# Patient Record
Sex: Female | Born: 1975 | Race: Black or African American | Hispanic: No | Marital: Married | Smoking: Never smoker
Health system: Southern US, Community
[De-identification: ages and names within clinical notes are randomized; demographics above are authoritative.]

## PROBLEM LIST (undated history)

## (undated) ENCOUNTER — Inpatient Hospital Stay (HOSPITAL_COMMUNITY): Payer: Self-pay

## (undated) DIAGNOSIS — D649 Anemia, unspecified: Secondary | ICD-10-CM

## (undated) DIAGNOSIS — R87629 Unspecified abnormal cytological findings in specimens from vagina: Secondary | ICD-10-CM

## (undated) DIAGNOSIS — O139 Gestational [pregnancy-induced] hypertension without significant proteinuria, unspecified trimester: Secondary | ICD-10-CM

## (undated) DIAGNOSIS — F419 Anxiety disorder, unspecified: Secondary | ICD-10-CM

## (undated) HISTORY — DX: Anemia, unspecified: D64.9

## (undated) HISTORY — PX: HIP SURGERY: SHX245

## (undated) HISTORY — DX: Anxiety disorder, unspecified: F41.9

## (undated) HISTORY — PX: CRYOTHERAPY: SHX1416

## (undated) HISTORY — DX: Unspecified abnormal cytological findings in specimens from vagina: R87.629

---
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
## 2009-12-20 ENCOUNTER — Emergency Department (HOSPITAL_COMMUNITY): Admission: EM | Admit: 2009-12-20 | Discharge: 2009-12-20 | Payer: Self-pay | Admitting: Emergency Medicine

## 2010-02-10 ENCOUNTER — Ambulatory Visit: Payer: Self-pay | Admitting: Family Medicine

## 2010-02-10 ENCOUNTER — Other Ambulatory Visit: Admission: RE | Admit: 2010-02-10 | Discharge: 2010-02-10 | Payer: Self-pay | Admitting: Family Medicine

## 2010-02-10 LAB — HM PAP SMEAR

## 2010-02-11 ENCOUNTER — Encounter: Payer: Self-pay | Admitting: Family Medicine

## 2010-02-11 LAB — CONVERTED CEMR LAB
Calcium: 9 mg/dL (ref 8.4–10.5)
Cholesterol: 120 mg/dL (ref 0–200)
Creatinine, Ser: 0.6 mg/dL (ref 0.4–1.2)
LDL Cholesterol: 69 mg/dL (ref 0–99)
Potassium: 4 meq/L (ref 3.5–5.1)
Sodium: 136 meq/L (ref 135–145)
Triglycerides: 29 mg/dL (ref 0.0–149.0)
VLDL: 5.8 mg/dL (ref 0.0–40.0)

## 2010-02-16 ENCOUNTER — Encounter: Payer: Self-pay | Admitting: Family Medicine

## 2010-02-16 LAB — CONVERTED CEMR LAB: Pap Smear: NEGATIVE

## 2010-04-27 ENCOUNTER — Ambulatory Visit: Payer: Self-pay | Admitting: Family Medicine

## 2010-04-27 DIAGNOSIS — J029 Acute pharyngitis, unspecified: Secondary | ICD-10-CM | POA: Insufficient documentation

## 2010-04-27 DIAGNOSIS — L259 Unspecified contact dermatitis, unspecified cause: Secondary | ICD-10-CM

## 2010-04-28 ENCOUNTER — Encounter: Payer: Self-pay | Admitting: Family Medicine

## 2010-04-28 ENCOUNTER — Telehealth: Payer: Self-pay | Admitting: Family Medicine

## 2010-05-02 ENCOUNTER — Telehealth: Payer: Self-pay | Admitting: Family Medicine

## 2010-05-31 ENCOUNTER — Ambulatory Visit
Admission: RE | Admit: 2010-05-31 | Discharge: 2010-05-31 | Payer: Self-pay | Source: Home / Self Care | Attending: Family Medicine | Admitting: Family Medicine

## 2010-05-31 DIAGNOSIS — J069 Acute upper respiratory infection, unspecified: Secondary | ICD-10-CM | POA: Insufficient documentation

## 2010-06-21 NOTE — Letter (Signed)
Summary: Out of Work  [employer] at [facility]  [address] [place], [place] [address]   Phone: [phone]  Fax: [phone]    April 28, 2010   Employee:  PROSPERITY DARROUGH [facility]    To Whom It May Concern:   For Medical reasons, please excuse the above named employee from work for the following dates:  Start:   Patient should return to work on Monday, May 02, 2010.    If you need additional information, please feel free to contact our office.         Sincerely,      Dr. Ruthe Mannan

## 2010-06-21 NOTE — Letter (Signed)
Summary: Generic Letter  [place] at [facility]  [address] [place], [place] [address]   Phone: [phone]  Fax: [phone]    02/11/2010  [facility] [address] [place], [place]  [address]  Dear Ms. Gabrielle Hill,    It was nice to meet you Gabrielle Hill!  We have received your lab results and Dr. Dayton Martes says that your labs are fabulous!! Electrolytes, kidney, and liver functions are great.  Cholesterol is amazing!  Keep up the good work.  Enclosed you will find a copy of your lab results.    Sincerely,        Linde Gillis CMA (AAMA)for Dr. Ruthe Mannan

## 2010-06-21 NOTE — Letter (Signed)
Summary: Out of Work  [employer] at [facility]  [address] [place], [place] [address]   Phone: [phone]  Fax: [phone]    April 27, 2010   Employee:  Gabrielle Hill [employer]    To Whom It May Concern:   For Medical reasons, please excuse the above named employee from work for the following dates:  Start:   04/25/2010  End:   04/28/2010  If you need additional information, please feel free to contact our office.         Sincerely,    Ruthe Mannan MD

## 2010-06-21 NOTE — Progress Notes (Signed)
Summary: needs work note extended  Phone Note Call from Patient Call back at [phone]   Caller: Patient Call For: Ruthe Mannan MD Summary of Call: Pt is asking to have work note extended through tomorrow.  She still has a sore throat and it is difficult for her to talk.  Call when ready and she will pick up. Initial call taken by: Lowella Petties CMA, AAMA,  April 28, 2010 12:23 PM  Follow-up for Phone Call        ok to write her a note stating that she can be out. Ruthe Mannan MD  April 28, 2010 12:26 PM  Patient advised as instructed via telephone, note for work ready for pick up will be left at front desk. Follow-up by: Linde Gillis CMA Duncan Dull),  April 28, 2010 12:33 PM

## 2010-06-21 NOTE — Letter (Signed)
Summary: Results Follow up Letter  [place] at [facility]  [address] [place], [place] [address]   Phone: [phone]  Fax: [phone]    02/16/2010 MRN: [id number]  [facility] [address] [place], [place]  [address]  Dear Gabrielle Hill,  The following are the results of your recent test(s):  Test         Result    Pap Smear:        Normal __X___  Not Normal _____ Comments: ______________________________________________________ Cholesterol: LDL(Bad cholesterol):         Your goal is less than:         HDL (Good cholesterol):       Your goal is more than: Comments:  ______________________________________________________ Mammogram:        Normal _____  Not Normal _____ Comments:  ___________________________________________________________________ Hemoccult:        Normal _____  Not normal _______ Comments:    _____________________________________________________________________ Other Tests:    We routinely do not discuss normal results over the telephone.  If you desire a copy of the results, or you have any questions about this information we can discuss them at your next office visit.   Sincerely,      Linde Gillis, CMA (AAMA) Dr. Ruthe Mannan, MD

## 2010-06-21 NOTE — Assessment & Plan Note (Signed)
Summary: new patiient/wants cpx if possible/rbh   Vital Signs:  Patient profile:   35 year old female Height:      66.75 inches Weight:      194 pounds BMI:     30.72 Temp:     98.2 degrees F oral Pulse rate:   68 / minute Pulse rhythm:   regular BP sitting:   110 / 64  (right arm) Cuff size:   regular  Vitals Entered By: Linde Gillis CMA Duncan Dull) (February 10, 2010 1:53 PM) CC: new patient, establish care   History of Present Illness: 35 yo here to establish care and for CPX with no complaints.  Well woman- G1P1, not currently sexually active.  No h/o abnormal pap smears.  Denies any urinary frequeny, vaginal discharge or other complaints.   Very active, works out and job requires high level of activity.  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    Due for tetanus shot, flu shot, and screening labs.  Preventive Screening-Counseling & Management  Alcohol-Tobacco     Smoking Status: never  Caffeine-Diet-Exercise     Does Patient Exercise: yes  Current Medications (verified): 1)  None  Allergies (verified): No Known Drug Allergies  Past History:  Family History: Last updated: 02/10/2010 Mom- sarcoidosis, s/p lung transplant, post op diabetes.  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  Risk Factors: Exercise: yes (02/10/2010)  Risk Factors: Smoking Status: never (02/10/2010)  Past Medical History: G1P1, uncomplicated pregnancy and delivery  Past Surgical History: Denies surgical history  Family History: Mom- sarcoidosis, s/p lung transplant, post op diabetes.  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 Smoking Status:  never Does Patient Exercise:  yes  Review of Systems      See HPI General:   Denies malaise. Eyes:  Denies blurring. ENT:  Denies difficulty swallowing. CV:  Denies chest pain or discomfort. Resp:  Denies shortness of breath. GI:  Denies abdominal pain and change in bowel habits. GU:  Denies abnormal vaginal bleeding, discharge, dysuria, genital sores, and hematuria. MS:  Denies joint pain, joint redness, and joint swelling. Derm:  Denies rash. Neuro:  Denies falling down and headaches. Psych:  Denies anxiety and depression. Endo:  Denies cold intolerance and heat intolerance. Heme:  Denies abnormal bruising and bleeding.  Physical Exam  General:  alert and well-developed.   Head:  normocephalic and atraumatic.   Eyes:  vision grossly intact, pupils equal, pupils round, and pupils reactive to light.   Ears:  R ear normal and L ear normal.   Nose:  no external deformity.   Mouth:  good dentition.   Neck:  No deformities, masses, or tenderness noted. Breasts:  No mass, nodules, thickening, tenderness, bulging, retraction, inflamation, nipple discharge or skin changes noted.   Lungs:  Normal respiratory effort, chest expands symmetrically. Lungs are clear to auscultation, no crackles or wheezes. Heart:  Normal rate and regular rhythm. S1 and S2 normal without gallop, murmur, click, rub or other extra sounds. Abdomen:  Bowel sounds positive,abdomen soft and non-tender without masses, organomegaly or hernias noted. Rectal:  no external abnormalities and no hemorrhoids.   Genitalia:  Pelvic Exam:        External: normal female genitalia without lesions or masses        Vagina: normal  without lesions or masses        Cervix: normal without lesions or masses        Adnexa: normal bimanual exam without masses or fullness        Uterus: normal by palpation        Pap smear: performed Msk:  No deformity or scoliosis noted of thoracic or lumbar spine.   Extremities:  No clubbing, cyanosis, edema, or deformity noted with normal full range of motion of all joints.     Neurologic:  alert & oriented X3 and gait normal.   Skin:  Intact without suspicious lesions or rashes Psych:  Oriented X3, normally interactive, good eye contact, not anxious appearing, and not depressed appearing.     Impression & Recommendations:  Problem # 1:  PREVENTIVE HEALTH CARE (ICD-V70.0) Reviewed preventive care protocols, scheduled due services, and updated immunizations Discussed nutrition, exercise, diet, and healthy lifestyle.  Pap, Tdap, flu shot today. BMET, direct LDL today. Orders: Venipuncture (93235) TLB-BMP (Basic Metabolic Panel-BMET) (80048-METABOL)  Other Orders: TLB-Lipid Panel (80061-LIPID) Flu Vaccine 70yrs + (57322) Admin 1st Vaccine (02542) Tdap => 50yrs IM (70623) Admin of Any Addtl Vaccine (76283)  Patient Instructions: 1)  It was so nice to meet you, Gabrielle Hill. 2)  We will be in touch with your lab work and pap smear. 3)  Please let us know if you need [place] for anything!  Prior Medications (reviewed today): None Current Allergies (reviewed today): No known allergies   Prevention & Chronic Care Immunizations   Influenza vaccine: Fluvax 3+  (02/10/2010)    Tetanus booster: 02/10/2010: Tdap    Pneumococcal vaccine: Not documented  Other Screening   Pap smear: Not documented   Pap smear action/deferral: Ordered  (02/10/2010)   Smoking status: never  (02/10/2010)   Nursing Instructions: Give tetanus booster today Give Flu vaccine today Pap smear today    Past Medical History:    G1P1, uncomplicated pregnancy and delivery  Past Surgical History:    Denies surgical history    Immunizations Administered:  Influenza Vaccine # 1:    Vaccine Type: Fluvax 3+    Site: right deltoid    Mfr: GlaxoSmithKline    Dose: 0.5 ml    Route: IM    Given by: Linde Gillis CMA (AAMA)    Exp. Date: 11/19/2010    Lot #: [id number]    VIS given: 12/14/09 version given February 10, 2010.  Tetanus Vaccine:    Vaccine Type: Tdap    Site:  right deltoid    Mfr: GlaxoSmithKline    Dose: 0.5 ml    Route: IM    Given by: Linde Gillis CMA (AAMA)    Exp. Date: 02/10/2012    Lot #: [id number]    VIS given: 04/08/08 version given February 10, 2010.

## 2010-06-21 NOTE — Assessment & Plan Note (Signed)
Summary: F/U FROM URGENT CARE FACILITY OUT OF TOWN / LFW   Vital Signs:  Patient profile:   35 year old female Height:      66.75 inches Weight:      188 pounds BMI:     29.77 Temp:     98.7 degrees F oral Pulse rate:   72 / minute Pulse rhythm:   regular BP sitting:   130 / 90  (left arm) Cuff size:   regular  Vitals Entered By: Linde Gillis CMA Duncan Dull) (April 27, 2010 10:39 AM) CC: urgent care follow up   History of Present Illness: 35 yo here for urgent care follow up.  Went on a trip to [place]. The day after she arrived, she wake up with a rash around her eyes on her cheeks.  Rash was very itchy (she bring pictures of rash)- appears erythematous, macular/papular. Rash was non painful.  No eruptions elsewhere on her body.  A few hours later, throat started hurting, felt feverish and very achy.   Went to an urgent care (awaiting records).  Per pt, rapid strep was negative. Sent home with 10 day course of Penicillin and 5 day course of prednisone 40 mg daily ( has one dose left). Rash has almost completely resolved. Itching improved with Benadryl. Throat no longer sore and she is afebrile. No other URI symptoms.  Current Medications (verified): 1)  None  Allergies (verified): No Known Drug Allergies  Past History:  Past Medical History: Last updated: 02/10/2010 G1P1, uncomplicated pregnancy and delivery  Past Surgical History: Last updated: 02/10/2010 Denies surgical history  Family History: Last updated: 02/10/2010 Mom- sarcoidosis, s/p lung transplant, post op diabetes.  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  Risk Factors: Exercise: yes (02/10/2010)  Risk Factors: Smoking Status: never (02/10/2010)  Review of Systems      See HPI General:  Denies fever. CV:  Denies chest pain or discomfort. Resp:  Denies cough. GI:  Denies  abdominal pain.  Physical Exam  General:  alert and well-developed.   VSS, non toxic appearing. Head:  normocephalic and atraumatic.   Eyes:  vision grossly intact, pupils equal, and pupils round.   Ears:  R ear normal and L ear normal.   Nose:  no external deformity.   Mouth:  good dentition and no gingival abnormalities.   Neck:  No deformities, masses, or tenderness noted. Lungs:  Normal respiratory effort, chest expands symmetrically. Lungs are clear to auscultation, no crackles or wheezes. Heart:  Normal rate and regular rhythm. S1 and S2 normal without gallop, murmur, click, rub or other extra sounds. Extremities:  No clubbing, cyanosis, edema, or deformity noted with normal full range of motion of all joints.   Skin:  very faint, barely visible raised erythematous area on left cheek Psych:  Oriented X3, normally interactive, good eye contact, not anxious appearing, and not depressed appearing.     Impression & Recommendations:  Problem # 1:  SORE THROAT (ICD-462) Assessment New Resolving.  Agree with finish course of PCN regardless of neg rapid strep. Rash,however, does not seem consistent with scarlet fever.  Problem # 2:  DERMATITIS (ICD-692.9) Assessment: New Resolving and appearins unrelated to #1.  Will await records but this appears like an allergic dermaitis.  Advised to call me immediately if rash worsens once she stops pred.   Continue Benadryl as needed itching.   Orders Added: 1)  Est. Patient Level IV [id number]    Current Allergies (reviewed today): No known allergies

## 2010-06-23 NOTE — Progress Notes (Signed)
Summary: call a nurse  Phone Note Call from Patient   Summary of Call: Triage Record Num: [id number] Operator: Migdalia Dk Patient Name: Gabrielle Hill Call Date & Time: 05/01/2010 7:13:43AM Patient Phone: [phone] PCP: Ruthe Mannan Patient Gender: Female PCP Fax : [phone] Patient DOB: [birth year] Practice Name: [facility] Reason for Call: Pt. states taking PCN and Prednisone since 04/24/2010. Seen in office on 05/06/2010 for follow up and instructed to keep taking medications as prescribed. "I was taking the Prednisone for a rash and my last dose of Prednisone was on 04/29/2010 and now the rash is back." This a.m. rash back on face, fine red rash, itching. "Dr. Dayton Martes told me to call if the rash came back." Spoke with Dr. Dayton Martes and to call in Prednisone 20 mg, 2 tabs po qd x 3 days then 1 tab qd x 3 days, then 1/2 tab qd x 2 days-- if rash continues to call office. Pt. informed and voiced understanding. Script for Prednisone 20 mg tabs, disp 10 , no refills, to be taken as above--called to [facility] at [phone] and left on voicemail. Protocol(s) Used: Medication Question Calls, No Triage (Adults) Recommended Outcome per Protocol: Call Provider Immediately Reason for Outcome: [1] Urgent new prescription or refill (likelihood of harm to patient if not taken) AND [2] triager unable to fill per unit policy Care Advice:  ~ 05/01/2010 7:32:56AM Page 1 of 1 [email or web address] Initial call taken by: Melody Comas,  May 02, 2010 8:10 AM  Follow-up for Phone Call        Left a message on voicemail for patient to call back. Sydell Axon LPN  May 02, 2010 11:41 AM  Spoke to patient and was informed that she took 2 of the Prednisone yesterday and 2 today and the rash is gone.  Patient will finish the medication and call back for a referral to a dermotologist if the rash comes back. Follow-up by: Sydell Axon LPN,  May 02, 2010 3:30 PM     Can you please call and  check on [name]? Thanks, Ruthe Mannan MD  May 02, 2010 8:11 AM

## 2010-06-23 NOTE — Assessment & Plan Note (Signed)
Summary: STREP THROAT ???   Vital Signs:  Patient profile:   35 year old female Height:      66.75 inches Weight:      189.75 pounds BMI:     30.05 Temp:     99 degrees F oral Pulse rate:   84 / minute Pulse rhythm:   regular BP sitting:   110 / 76  (left arm) Cuff size:   regular  Vitals Entered By: Delilah Shan CMA Maitlyn Penza Dull) (May 31, 2010 2:09 PM) CC: ? Strep   History of Present Illness: Tired and feeling warm.  Overnight pain with swallowing and insomnia.  Taking tylenol and tea.  Using cough drops and tylenol.  Went to early work shift this AM.  No cough.  Ear pain bilaterally.  No nose congestion.  Voice is deeper today. No material sticking with swallowing.   Allergies: No Known Drug Allergies  Review of Systems       See HPI.  Otherwise negative.    Physical Exam  General:  GEN: nad, alert and oriented HEENT: mucous membranes moist, TM w/o erythema, nasal epithelium injected, OP with cobblestoning but no exudates NECK: supple w/o LA CV: rrr. PULM: ctab, no inc wob ABD: soft, +bs EXT: no edema    Impression & Recommendations:  Problem # 1:  URI (ICD-465.9) Likely viral and no indication for antibiotics.  follow up as needed.  d/w patient.  Supporitve tx o/w.  Nontoxic.  she agrees.   Complete Medication List: 1)  Oral Contraceptives  .... Once daily 2)  Lidocaine Viscous 2 % Soln (Lidocaine hcl) .... 5 ml swish and swallow qid as needed for sore throat  Other Orders: Rapid Strep ([address])  Patient Instructions: 1)  I think you have a viral infection that will gradually resolve.  I would get some rest, drink plenty of fluids, and take ibuprofen with food as needed.  Gargle with warm salt water for your throat.  If the pain continues, try the lidocaine.  Take care.  Prescriptions: LIDOCAINE VISCOUS 2 % SOLN (LIDOCAINE HCL) 5 ml swish and swallow qid as needed for sore throat  #6oz x 0   Entered and Authorized by:   Crawford Givens MD   Signed by:   Crawford Givens MD on 05/31/2010   Method used:   Print then Give to Patient   RxID:   [id number]    Orders Added: 1)  Rapid Strep [95621] 2)  Est. Patient Level III [30865]    Current Allergies (reviewed today): No known allergies   Laboratory Results  Date/Time Received: May 31, 2010 2:11 PM   Other Tests  Rapid Strep: negative

## 2010-08-05 LAB — CBC
HCT: 35.5 % — ABNORMAL LOW (ref 36.0–46.0)
Hemoglobin: 11.8 g/dL — ABNORMAL LOW (ref 12.0–15.0)
MCV: 83.1 fL (ref 78.0–100.0)
RBC: 4.27 MIL/uL (ref 3.87–5.11)

## 2010-08-05 LAB — DIFFERENTIAL
Basophils Absolute: 0 10*3/uL (ref 0.0–0.1)
Basophils Relative: 1 % (ref 0–1)
Lymphocytes Relative: 36 % (ref 12–46)
Monocytes Relative: 9 % (ref 3–12)

## 2010-08-05 LAB — URINE MICROSCOPIC-ADD ON

## 2010-08-05 LAB — URINALYSIS, ROUTINE W REFLEX MICROSCOPIC
Bilirubin Urine: NEGATIVE
Nitrite: NEGATIVE
Protein, ur: NEGATIVE mg/dL
Specific Gravity, Urine: 1.008 (ref 1.005–1.030)
Urobilinogen, UA: 0.2 mg/dL (ref 0.0–1.0)

## 2010-08-05 LAB — COMPREHENSIVE METABOLIC PANEL
AST: 22 U/L (ref 0–37)
Albumin: 3.9 g/dL (ref 3.5–5.2)
BUN: 7 mg/dL (ref 6–23)
Chloride: 107 mEq/L (ref 96–112)
GFR calc Af Amer: >60 mL/min (ref >60)

## 2010-08-05 LAB — POCT PREGNANCY, URINE: Preg Test, Ur: NEGATIVE

## 2010-08-05 LAB — LIPASE, BLOOD: Lipase: 23 U/L (ref 11–59)

## 2010-12-06 ENCOUNTER — Encounter: Payer: Self-pay | Admitting: Family Medicine

## 2010-12-07 ENCOUNTER — Ambulatory Visit (INDEPENDENT_AMBULATORY_CARE_PROVIDER_SITE_OTHER): Payer: 59 | Admitting: Family Medicine

## 2010-12-07 ENCOUNTER — Encounter: Payer: Self-pay | Admitting: Family Medicine

## 2010-12-07 DIAGNOSIS — G43909 Migraine, unspecified, not intractable, without status migrainosus: Secondary | ICD-10-CM | POA: Insufficient documentation

## 2010-12-07 MED ORDER — CYCLOBENZAPRINE HCL 10 MG PO TABS: 5.0000 mg | ORAL_TABLET | Freq: Three times a day (TID) | ORAL | Status: DC | PRN

## 2010-12-07 MED ORDER — PROMETHAZINE HCL 25 MG PO TABS: 25.0000 mg | ORAL_TABLET | Freq: Four times a day (QID) | ORAL | Status: AC | PRN

## 2010-12-07 NOTE — Assessment & Plan Note (Signed)
Likely migraine.  D/w pt about caffeine, limiting nsaids, prev vs abortive tx.  Use phenergan and flexeril for now, rest and then f/u prn esp if HA frequency increases.  She agrees. Nontoxic, f/u prn. >25 min spent with face to face with patient, >50% counseling.

## 2010-12-07 NOTE — Progress Notes (Signed)
duration of symptoms: 2 weeks of HA, would take some advil with some help initially but the effect has diminished.   rhinorrhea:no Congestion:yes, with mild facial pressure ear pain: as below sore throat: yes, mild Cough: no Myalgias: as below Fevers: +subj fever, episodic, no documented fevers >100.4 other concerns: R>L ear pain last few days.  Neck muscles are sore.  The HA was most noticeable for the patient.  Other sx are less troubling to her.  Lights bother her.   Throbbing HA.  Better in a dark quiet room.  Some nausea with the HA. No smoking. Etoh: occ/rare. NSAIDS-rare use, except recently Hasn't missed work yet.  Heavy use of computers at work.   HA temporarily better with coffee.  ROS: See HPI.  Otherwise negative.    Meds, vitals, and allergies reviewed.   GEN: nad, alert and oriented, prefers the lights lowered HEENT: mucous membranes moist, TM w/o erythema, nasal epithelium not injected, OP without cobblestoning NECK: supple w/o LA, posterior paraspinal muscles ttp B but no midline pain CV: rrr. PULM: ctab, no inc wob ABD: soft, +bs EXT: no edema CN 2-12 wnl B, S/S/DTR wnl x4

## 2010-12-07 NOTE — Patient Instructions (Signed)
Take the flexeril (muscle spasm) and phenergan (nausea) as needed.  Both can make you drowsy.  Gradually cut down on caffeine.  Let Dr. Dayton Martes know if you continue to have pain.

## 2010-12-15 ENCOUNTER — Encounter: Payer: Self-pay | Admitting: Family Medicine

## 2010-12-15 DIAGNOSIS — Z0000 Encounter for general adult medical examination without abnormal findings: Secondary | ICD-10-CM | POA: Insufficient documentation

## 2010-12-15 NOTE — Progress Notes (Signed)
Pt cancelled

## 2010-12-21 ENCOUNTER — Encounter: Payer: 59 | Admitting: Family Medicine

## 2010-12-23 ENCOUNTER — Ambulatory Visit (INDEPENDENT_AMBULATORY_CARE_PROVIDER_SITE_OTHER): Payer: 59 | Admitting: Family Medicine

## 2010-12-23 ENCOUNTER — Other Ambulatory Visit (HOSPITAL_COMMUNITY)
Admission: RE | Admit: 2010-12-23 | Discharge: 2010-12-23 | Disposition: A | Discharge status: Home / Self Care | Payer: 59 | Source: Ambulatory Visit | Attending: Family Medicine | Admitting: Family Medicine

## 2010-12-23 ENCOUNTER — Encounter: Payer: Self-pay | Admitting: Family Medicine

## 2010-12-23 VITALS — BP 102/70 | HR 75 | Temp 98.6°F | Wt 180.2 lb

## 2010-12-23 DIAGNOSIS — Z0000 Encounter for general adult medical examination without abnormal findings: Secondary | ICD-10-CM

## 2010-12-23 DIAGNOSIS — Z136 Encounter for screening for cardiovascular disorders: Secondary | ICD-10-CM

## 2010-12-23 DIAGNOSIS — R87810 Cervical high risk human papillomavirus (HPV) DNA test positive: Secondary | ICD-10-CM | POA: Insufficient documentation

## 2010-12-23 DIAGNOSIS — Z124 Encounter for screening for malignant neoplasm of cervix: Secondary | ICD-10-CM | POA: Insufficient documentation

## 2010-12-23 MED ORDER — TOPIRAMATE 25 MG PO TABS: 25.0000 mg | ORAL_TABLET | Freq: Every evening | ORAL | Status: DC

## 2010-12-23 NOTE — Progress Notes (Signed)
Addended by: Gilmer Mor on: 12/23/2010 03:52 PM   Modules accepted: Orders

## 2010-12-23 NOTE — Progress Notes (Signed)
Addended by: Alvina Chou on: 12/23/2010 04:35 PM   Modules accepted: Orders

## 2010-12-23 NOTE — Progress Notes (Signed)
35 yo here to establish care and for CPX with no complaints.  Saw Dr. Para March for migraines last week.  Flexeril making her too tired. Has had a migraine daily for past month- +photophobia, nausea, vomiting.    Increased stressors at work. Usually unilateral, behind her eye. No focal neurological changes.   Well woman- G1P1, not currently sexually active.  No h/o abnormal pap smears.  Denies any urinary frequeny, vaginal discharge or other complaints.   Very active, works out and job requires high level of activity.  Patient Active Problem List  Diagnoses  . Migraine  . Routine general medical examination at a health care facility   No past medical history on file. No past surgical history on file. History  Substance Use Topics  . Smoking status: Never Smoker   . Smokeless tobacco: Not on file  . Alcohol Use: Yes     Occasionally   Family History  Problem Relation Age of Onset  . Sarcoidosis Mother     s/p lung transplant, post op diabetes  . Arthritis Father   . Dementia Father   . Migraines Neg Hx    No Known Allergies Current Outpatient Prescriptions on File Prior to Visit  Medication Sig Dispense Refill  . cyclobenzaprine (FLEXERIL) 10 MG tablet Take 0.5-1 tablets (5-10 mg total) by mouth every 8 (eight) hours as needed (for migraine/muscle pain, sedation caution).  30 tablet  1  . Norgestim-Eth Estrad Triphasic (ORTHO TRI-CYCLEN, 28,) 0.18/0.215/0.25 MG-35 MCG TABS Take 1 tablet by mouth daily.         The PMH, PSH, Social History, Family History, Medications, and allergies have been reviewed in [facility], and have been updated if relevant.    Review of Systems       See HPI Patient reports no  vision/ hearing changes,anorexia, weight change, fever ,adenopathy, persistant / recurrent hoarseness, swallowing issues, chest pain, edema,persistant / recurrent cough, hemoptysis, dyspnea(rest, exertional, paroxysmal nocturnal), gastrointestinal  bleeding (melena, rectal  bleeding), abdominal pain, excessive heart burn, GU symptoms(dysuria, hematuria, pyuria, voiding/incontinence  Issues) syncope, focal weakness, severe memory loss, concerning skin lesions, depression, anxiety, abnormal bruising/bleeding, major joint swelling, breast masses or abnormal vaginal bleeding.     Physical Exam BP 102/70  Pulse 75  Temp(Src) 98.6 F (37 C) (Oral)  Wt 180 lb 4 oz (81.761 kg)  LMP 12/01/2010  General:  alert and well-developed.   Head:  normocephalic and atraumatic.   Eyes:  vision grossly intact, pupils equal, pupils round, and pupils reactive to light.   Ears:  R ear normal and L ear normal.   Nose:  no external deformity.   Mouth:  good dentition.   Neck:  No deformities, masses, or tenderness noted. Breasts:  No mass, nodules, thickening, tenderness, bulging, retraction, inflamation, nipple discharge or skin changes noted.   Lungs:  Normal respiratory effort, chest expands symmetrically. Lungs are clear to auscultation, no crackles or wheezes. Heart:  Normal rate and regular rhythm. S1 and S2 normal without gallop, murmur, click, rub or other extra sounds. Abdomen:  Bowel sounds positive,abdomen soft and non-tender without masses, organomegaly or hernias noted. Rectal:  no external abnormalities and no hemorrhoids.   Genitalia:  Pelvic Exam:        External: normal female genitalia without lesions or masses        Vagina: normal without lesions or masses        Cervix: normal without lesions or masses        Adnexa: normal bimanual  exam without masses or fullness        Uterus: normal by palpation        Pap smear: performed Msk:  No deformity or scoliosis noted of thoracic or lumbar spine.   Extremities:  No clubbing, cyanosis, edema, or deformity noted with normal full range of motion of all joints.   Neurologic:  alert & oriented X3 and gait normal.   Skin:  Intact without suspicious lesions or rashes Psych:  Oriented X3, normally interactive, good eye  contact, not anxious appearing, and not depressed appearing.    Assessment and Plan: 1. Routine general medical examination at a health care facility    Reviewed preventive care protocols, scheduled due services, and updated immunizations Discussed nutrition, exercise, diet, and healthy lifestyle.  Pap smear today. Orders Placed This Encounter  Procedures  . Basic Metabolic Panel (BMET)  . Lipid Profile   2 Migraine     Deteriorated.  Likely stress related.  Will start prophylaxis with Topamax. See pt instructions for details.

## 2010-12-23 NOTE — Patient Instructions (Signed)
Great to see you, Gabrielle Hill. Take the Topamax nightly to prevent headaches and you can still take the flexeril when you have a migraine. Call me in a month or so to let me know how you are doing( feel free to call my cell at [phone].

## 2010-12-24 LAB — LIPID PANEL: LDL Cholesterol: 75 mg/dL (ref 0–99)

## 2010-12-24 LAB — BASIC METABOLIC PANEL
BUN: 12 mg/dL (ref 6–23)
CO2: 24 mEq/L (ref 19–32)
Glucose, Bld: 94 mg/dL (ref 70–99)
Sodium: 140 mEq/L (ref 135–145)

## 2010-12-29 ENCOUNTER — Encounter: Payer: Self-pay | Admitting: *Deleted

## 2011-01-16 ENCOUNTER — Telehealth: Payer: Self-pay | Admitting: *Deleted

## 2011-01-16 NOTE — Telephone Encounter (Signed)
Ok, I will sign it.

## 2011-01-16 NOTE — Telephone Encounter (Signed)
Patient has a form that she received back in the mail that needs your signature. Patient will drop it off this afternoon and will need this back prior to Friday.

## 2011-03-21 ENCOUNTER — Ambulatory Visit (INDEPENDENT_AMBULATORY_CARE_PROVIDER_SITE_OTHER): Payer: 59 | Admitting: Family Medicine

## 2011-03-21 ENCOUNTER — Encounter: Payer: Self-pay | Admitting: Family Medicine

## 2011-03-21 DIAGNOSIS — F411 Generalized anxiety disorder: Secondary | ICD-10-CM

## 2011-03-21 DIAGNOSIS — Z113 Encounter for screening for infections with a predominantly sexual mode of transmission: Secondary | ICD-10-CM

## 2011-03-21 DIAGNOSIS — N946 Dysmenorrhea, unspecified: Secondary | ICD-10-CM

## 2011-03-21 DIAGNOSIS — R079 Chest pain, unspecified: Secondary | ICD-10-CM | POA: Insufficient documentation

## 2011-03-21 DIAGNOSIS — F419 Anxiety disorder, unspecified: Secondary | ICD-10-CM

## 2011-03-21 LAB — BASIC METABOLIC PANEL
BUN: 7 mg/dL (ref 6–23)
CO2: 24 mEq/L (ref 19–32)
Calcium: 9.3 mg/dL (ref 8.4–10.5)
GFR: 186.74 mL/min (ref >60.00)

## 2011-03-21 LAB — TSH: TSH: 2.23 u[IU]/mL (ref 0.35–5.50)

## 2011-03-21 LAB — POCT URINE PREGNANCY: Preg Test, Ur: POSITIVE

## 2011-03-21 MED ORDER — ALPRAZOLAM 0.25 MG PO TABS: 0.2500 mg | ORAL_TABLET | Freq: Three times a day (TID) | ORAL | Status: AC | PRN

## 2011-03-21 NOTE — Patient Instructions (Signed)
[facility] [address]  [address] [place], [place] [address]  phone: [phone]   Hang in there. Let me know if your symptoms return. Try taking a Pepcid this evening. Take a Xanax three times daily as needed.

## 2011-03-21 NOTE — Progress Notes (Signed)
  Subjective:    Patient ID: Gabrielle Hill, female    DOB: [birth year], 35 y.o.   MRN: [id number]  HPI  35 yo healthy female here for CP.  Under significant stress at work and at home lately. Has been anxious and more tearful. Decreased appetite. No SI or HI. Feels likes she is "falling apart."  This morning, woke up with some epigastric and right sided intermittent chest pain. Felt short of breath as well. Not worsened by exertion. No diaphoresis, nausea or vomiting.  Had unprotected sex a couple of weeks ago as well. Worried she might be pregnant.  LMP 02/20/2011.  Review of Systems See HPI   Patient reports no  vision/ hearing changes,anorexia, weight change, fever ,adenopathy, persistant / recurrent hoarseness, swallowing issues,edema,persistant / recurrent cough, hemoptysis, dyspnea(rest, exertional, paroxysmal nocturnal), gastrointestinal  bleeding (melena, rectal bleeding), abdominal pain, excessive heart burn, GU symptoms(dysuria, hematuria, pyuria, voiding/incontinence  Issues) syncope, focal weakness,breast masses or abnormal vaginal bleeding.    Objective:   Physical Exam BP 108/70  Pulse 65  Temp(Src) 98.7 F (37.1 C) (Oral)  Ht 5' 6.75" (1.695 m)  Wt 189 lb 12 oz (86.07 kg)  BMI 29.94 kg/m2  LMP 02/20/2011  General:  Well-developed,well-nourished,in no acute distress; alert,appropriate and cooperative throughout examination Head:  normocephalic and atraumatic.   Eyes:  vision grossly intact, pupils equal, pupils round, and pupils reactive to light.   Ears:  R ear normal and L ear normal.   Nose:  no external deformity.   Mouth:  good dentition.   Neck:  No deformities, masses, or tenderness noted. Lungs:  Normal respiratory effort, chest expands symmetrically. Lungs are clear to auscultation, no crackles or wheezes. Heart:  Normal rate and regular rhythm. S1 and S2 normal without gallop, murmur, click, rub or other extra sounds. Abdomen:  Bowel sounds  positive,abdomen soft and non-tender without masses, organomegaly or hernias noted.  Extremities:  No clubbing, cyanosis, edema, or deformity noted with normal full range of motion of all joints.   Neurologic:  alert & oriented X3 and gait normal.   Skin:  Intact without suspicious lesions or rashes Psych:  Cognition and judgment appear intact. Alert and cooperative with normal attention span and concentration. Tearful.    Assessment & Plan:   1. Chest pain  New- EKG NSR, unlikely cardiac in origin. Most likely due to anxiety and early pregnancy. Will also check labs. TSH, CBC w/Diff, Basic Metabolic Panel (BMET)  2. Anxiety   See above.  Will refer to psychotherapy. Rx given for as needed xanax (advise caution in late pregnancy)   3. Screening for STD (sexually transmitted disease)  RPR, HIV Antibody ( Reflex)  4. Pregnancy- Pt will like to think about what she wants to do prior to referral. She will touch base with me. POCT urine pregnancy

## 2011-03-22 ENCOUNTER — Encounter: Payer: Self-pay | Admitting: *Deleted

## 2011-03-22 LAB — CBC WITH DIFFERENTIAL/PLATELET
Basophils Absolute: 0 10*3/uL (ref 0.0–0.1)
HCT: 36.9 % (ref 36.0–46.0)
Neutrophils Relative %: 51.8 % (ref 43.0–77.0)
Platelets: 175 10*3/uL (ref 150.0–400.0)
RBC: 4.27 Mil/uL (ref 3.87–5.11)
WBC: 7.6 10*3/uL (ref 4.5–10.5)

## 2011-03-22 LAB — RPR

## 2011-03-22 LAB — HIV ANTIBODY (ROUTINE TESTING W REFLEX): HIV: NONREACTIVE

## 2011-03-31 ENCOUNTER — Encounter: Payer: Self-pay | Admitting: Family Medicine

## 2011-03-31 ENCOUNTER — Ambulatory Visit (INDEPENDENT_AMBULATORY_CARE_PROVIDER_SITE_OTHER): Payer: 59 | Admitting: Family Medicine

## 2011-03-31 VITALS — BP 120/70 | HR 80 | Temp 98.5°F | Ht 66.75 in | Wt 194.3 lb

## 2011-03-31 DIAGNOSIS — N926 Irregular menstruation, unspecified: Secondary | ICD-10-CM

## 2011-03-31 MED ORDER — NORGESTIM-ETH ESTRAD TRIPHASIC 0.18/0.215/0.25 MG-35 MCG PO TABS: 1.0000 | ORAL_TABLET | Freq: Every day | ORAL | Status: DC

## 2011-03-31 NOTE — Patient Instructions (Signed)
Good to see you Donise. Call you insurance company and ask if they cover the Mirena IUD- both insertion at the visit and cost of the IUD.  Levonorgestrel intrauterine device (IUD) What is this medicine? LEVONORGESTREL IUD (LEE voe nor jes trel) is a contraceptive (birth control) device. It is used to prevent pregnancy and to treat heavy bleeding that occurs during your period. It can be used for up to 5 years. This medicine may be used for other purposes; ask your health care provider or pharmacist if you have questions. What should I tell my health care provider before I take this medicine? They need to know if you have any of these conditions: -abnormal Pap smear -cancer of the breast, uterus, or cervix -diabetes -endometritis -genital or pelvic infection now or in the past -have more than one sexual partner or your partner has more than one partner -heart disease -history of an ectopic or tubal pregnancy -immune system problems -IUD in place -liver disease or tumor -problems with blood clots or take blood-thinners -use intravenous drugs -uterus of unusual shape -vaginal bleeding that has not been explained -an unusual or allergic reaction to levonorgestrel, other hormones, silicone, or polyethylene, medicines, foods, dyes, or preservatives -pregnant or trying to get pregnant -breast-feeding How should I use this medicine? This device is placed inside the uterus by a health care professional. Talk to your pediatrician regarding the use of this medicine in children. Special care may be needed. Overdosage: If you think you have taken too much of this medicine contact a poison control center or emergency room at once. NOTE: This medicine is only for you. Do not share this medicine with others. What if I miss a dose? This does not apply. What may interact with this medicine? Do not take this medicine with any of the following medications: -amprenavir -bosentan -fosamprenavir This  medicine may also interact with the following medications: -aprepitant -barbiturate medicines for inducing sleep or treating seizures -bexarotene -griseofulvin -medicines to treat seizures like carbamazepine, ethotoin, felbamate, oxcarbazepine, phenytoin, topiramate -modafinil -pioglitazone -rifabutin -rifampin -rifapentine -some medicines to treat HIV infection like atazanavir, indinavir, lopinavir, nelfinavir, tipranavir, ritonavir -St. John's wort -warfarin This list may not describe all possible interactions. Give your health care provider a list of all the medicines, herbs, non-prescription drugs, or dietary supplements you use. Also tell them if you smoke, drink alcohol, or use illegal drugs. Some items may interact with your medicine. What should I watch for while using this medicine? Visit your doctor or health care professional for regular check ups. See your doctor if you or your partner has sexual contact with others, becomes HIV positive, or gets a sexual transmitted disease. This product does not protect you against HIV infection (AIDS) or other sexually transmitted diseases. You can check the placement of the IUD yourself by reaching up to the top of your vagina with clean fingers to feel the threads. Do not pull on the threads. It is a good habit to check placement after each menstrual period. Call your doctor right away if you feel more of the IUD than just the threads or if you cannot feel the threads at all. The IUD may come out by itself. You may become pregnant if the device comes out. If you notice that the IUD has come out use a backup birth control method like condoms and call your health care provider. Using tampons will not change the position of the IUD and are okay to use during your period. What  side effects may I notice from receiving this medicine? Side effects that you should report to your doctor or health care professional as soon as possible: -allergic  reactions like skin rash, itching or hives, swelling of the face, lips, or tongue -fever, flu-like symptoms -genital sores -high blood pressure -no menstrual period for 6 weeks during use -pain, swelling, warmth in the leg -pelvic pain or tenderness -severe or sudden headache -signs of pregnancy -stomach cramping -sudden shortness of breath -trouble with balance, talking, or walking -unusual vaginal bleeding, discharge -yellowing of the eyes or skin Side effects that usually do not require medical attention (report to your doctor or health care professional if they continue or are bothersome): -acne -breast pain -change in sex drive or performance -changes in weight -cramping, dizziness, or faintness while the device is being inserted -headache -irregular menstrual bleeding within first 3 to 6 months of use -nausea This list may not describe all possible side effects. Call your doctor for medical advice about side effects. You may report side effects to FDA at 1-800-FDA-1088. Where should I keep my medicine? This does not apply. NOTE: This sheet is a summary. It may not cover all possible information. If you have questions about this medicine, talk to your doctor, pharmacist, or health care provider.  2012, [employer]vier/Gold Standard. (05/29/2008 6:39:08 PM)

## 2011-03-31 NOTE — Progress Notes (Signed)
  Subjective:    Patient ID: Gabrielle Hill, female    DOB: 1976-04-11, 35 y.o.   MRN: [id number]  HPI  35 yo healthy female here for follow up.  I saw her for anxiety and CP on 10/30.   EKG unremarkable but U preg positive.    Per pt, went to abortion clinic and U preg and transvaginal U/S was negative for pregnancy.  Since then, her period has started (03/28/2011). She feels fine, anxiety resolving.  Does want to start back on OCPs. Non smoker. No h/o blood clots.  Review of Systems See HPI   Patient reports no  vision/ hearing changes,anorexia, weight change, fever ,adenopathy, persistant / recurrent hoarseness, swallowing issues,edema,persistant / recurrent cough, hemoptysis, dyspnea(rest, exertional, paroxysmal nocturnal), gastrointestinal  bleeding (melena, rectal bleeding), abdominal pain, excessive heart burn, GU symptoms(dysuria, hematuria, pyuria, voiding/incontinence  Issues) syncope, focal weakness,breast masses or abnormal vaginal bleeding.    Objective:   Physical Exam BP 120/70  Pulse 80  Temp(Src) 98.5 F (36.9 C) (Oral)  Ht 5' 6.75" (1.695 m)  Wt 194 lb 5 oz (88.14 kg)  BMI 30.66 kg/m2  LMP 03/28/2011  General:  Well-developed,well-nourished,in no acute distress; alert,appropriate and cooperative throughout examination Head:  normocephalic and atraumatic.   Eyes:  vision grossly intact, pupils equal, pupils round, and pupils reactive to light.   Ears:  R ear normal and L ear normal.   Nose:  no external deformity.   Mouth:  good dentition.   Abdomen:  Bowel sounds positive,abdomen soft and non-tender without masses, organomegaly or hernias noted.  Extremities:  No clubbing, cyanosis, edema, or deformity noted with normal full range of motion of all joints.   Neurologic:  alert & oriented X3 and gait normal.   Skin:  Intact without suspicious lesions or rashes Psych:  Cognition and judgment appear intact. Alert and cooperative with normal attention span and  concentration. Tearful.    Assessment & Plan:   1. Positive Urine pregnancy test  Resolved, now neg. ?false positive vs early abortion. No further work up necessary. Will restart her OCP, rx sent.   POCT urine pregnancy

## 2011-04-03 ENCOUNTER — Ambulatory Visit: Payer: 59 | Admitting: Family Medicine

## 2011-05-29 ENCOUNTER — Encounter: Payer: Self-pay | Admitting: Family Medicine

## 2011-05-29 ENCOUNTER — Ambulatory Visit (INDEPENDENT_AMBULATORY_CARE_PROVIDER_SITE_OTHER): Payer: 59 | Admitting: Family Medicine

## 2011-05-29 VITALS — BP 120/80 | HR 94 | Temp 98.6°F | Wt 197.5 lb

## 2011-05-29 DIAGNOSIS — J029 Acute pharyngitis, unspecified: Secondary | ICD-10-CM

## 2011-05-29 LAB — POCT RAPID STREP A (OFFICE): Rapid Strep A Screen: NEGATIVE

## 2011-05-29 MED ORDER — MAGIC MOUTHWASH W/LIDOCAINE: 15.0000 mL | Freq: Four times a day (QID) | ORAL | Status: DC | PRN

## 2011-05-29 NOTE — Progress Notes (Signed)
SUBJECTIVE: 36 y.o. female with sore throat, myalgias, swollen glands for 2 days.  No fevers, cough or URI symptoms.  No history of rheumatic fever.   Patient Active Problem List  Diagnoses  . Migraine  . Routine general medical examination at a health care facility  . Chest pain  . Anxiety  . Screening for STD (sexually transmitted disease)   No past medical history on file. No past surgical history on file. History  Substance Use Topics  . Smoking status: Never Smoker   . Smokeless tobacco: Not on file  . Alcohol Use: Yes     Occasionally   Family History  Problem Relation Age of Onset  . Sarcoidosis Mother     s/p lung transplant, post op diabetes  . Arthritis Father   . Dementia Father   . Migraines Neg Hx    No Known Allergies Current Outpatient Prescriptions on File Prior to Visit  Medication Sig Dispense Refill  . ALPRAZolam (XANAX) 0.25 MG tablet Take 1 tablet (0.25 mg total) by mouth 3 (three) times daily as needed for anxiety.  90 tablet  0  . Norgestimate-Ethinyl Estradiol Triphasic (ORTHO TRI-CYCLEN, 28,) 0.18/0.215/0.25 MG-35 MCG tablet Take 1 tablet by mouth daily.  1 Package  11  . topiramate (TOPAMAX) 25 MG tablet Take 1 tablet (25 mg total) by mouth Nightly.  30 tablet  3   The PMH, PSH, Social History, Family History, Medications, and allergies have been reviewed in [facility], and have been updated if relevant.  OBJECTIVE: BP 120/80  Pulse 94  Temp(Src) 98.6 F (37 C) (Oral)  Wt 197 lb 8 oz (89.585 kg)   Vitals as noted above. Appears alert, well appearing, and in no distress.  Very uncomfortable when she swallows but voice not muffled. Ears: bilateral TM's and external ear canals normal Oropharynx: mucous membranes moist, pharynx normal without lesions and tonsillar hypertrophy without exudate, right >left Neck: supple, no significant adenopathy Lungs: clear to auscultation, no wheezes, rales or rhonchi, symmetric air entry Rapid Strep test is  negative  ASSESSMENT: Viral pharyngitis  PLAN: Per orders. Gargle with magic mouthwash, use acetaminophen or other OTC analgesic.  Follow up as needed.

## 2011-05-29 NOTE — Patient Instructions (Signed)
Great to see you. This is likely a virus. Gargle with magic mouthwash as directed. You can take up to 800 mg of Ibuprofen three times a day for next several days. Feel better soon!

## 2011-05-30 ENCOUNTER — Telehealth: Payer: Self-pay | Admitting: *Deleted

## 2011-05-30 NOTE — Telephone Encounter (Signed)
Yes ok to extend note.

## 2011-05-30 NOTE — Telephone Encounter (Signed)
Patient called to see if Dr. Dayton Martes would excuse her from work with a letter through Wednesday.  She stated that it's hurts to talk, eat, and swallow and since she has to use her voice for her job she really needs the extra time to rest and let her voice rest.  Please advise.

## 2011-05-30 NOTE — Telephone Encounter (Signed)
Letter written.  Patient advised via telephone, letter left at front desk for pick up.

## 2011-11-20 ENCOUNTER — Ambulatory Visit (INDEPENDENT_AMBULATORY_CARE_PROVIDER_SITE_OTHER): Payer: 59 | Admitting: Family Medicine

## 2011-11-20 ENCOUNTER — Encounter: Payer: Self-pay | Admitting: Family Medicine

## 2011-11-20 VITALS — BP 122/68 | HR 80 | Temp 98.1°F | Wt 199.2 lb

## 2011-11-20 DIAGNOSIS — J3089 Other allergic rhinitis: Secondary | ICD-10-CM

## 2011-11-20 DIAGNOSIS — J3081 Allergic rhinitis due to animal (cat) (dog) hair and dander: Secondary | ICD-10-CM

## 2011-11-20 MED ORDER — FLUTICASONE PROPIONATE 50 MCG/ACT NA SUSP: 2.0000 | Freq: Every day | NASAL | Status: DC

## 2011-11-20 NOTE — Patient Instructions (Addendum)
My fitnesspal. It was so great to see you. Congratulations! Try taking Zyrtec in the morning, benadryl in the evening. You can add flonase if symptoms deteriorate. Long term solution would be to see an allergist and receive allergy shots- if antihistamines work (benadryl, zyrtec), then I would stick with that for now. Call me in a few weeks and let me know how you're doing.

## 2011-11-20 NOTE — Progress Notes (Signed)
Subjective:    Patient ID: Gabrielle Hill, female    DOB: March 03, 1976, 36 y.o.   MRN: [id number]  HPI  Very pleasant 36 yo female here for ? Allergies.  Has never had seasonal allergies. She just adopted a cat.  Since bringing the cat into the home she is sneezing, having itchy/watery eyes.  No wheezing.  No hives.    No fevers. No SOB.  Patient Active Problem List  Diagnosis  . Migraine  . Routine general medical examination at a health care facility  . Chest pain  . Anxiety  . Screening for STD (sexually transmitted disease)  . Allergy to cats   No past medical history on file. No past surgical history on file. History  Substance Use Topics  . Smoking status: Never Smoker   . Smokeless tobacco: Not on file  . Alcohol Use: Yes     Occasionally   Family History  Problem Relation Age of Onset  . Sarcoidosis Mother     s/p lung transplant, post op diabetes  . Arthritis Father   . Dementia Father   . Migraines Neg Hx    No Known Allergies Current Outpatient Prescriptions on File Prior to Visit  Medication Sig Dispense Refill  . Norgestimate-Ethinyl Estradiol Triphasic (ORTHO TRI-CYCLEN, 28,) 0.18/0.215/0.25 MG-35 MCG tablet Take 1 tablet by mouth daily.  1 Package  11  . ALPRAZolam (XANAX) 0.25 MG tablet Take 1 tablet (0.25 mg total) by mouth 3 (three) times daily as needed for anxiety.  90 tablet  0  . Alum & Mag Hydroxide-Simeth (MAGIC MOUTHWASH W/LIDOCAINE) SOLN Take 15 mLs by mouth 4 (four) times daily as needed.  240 mL  0  . fluticasone (FLONASE) 50 MCG/ACT nasal spray Place 2 sprays into the nose daily.  16 g  6  . topiramate (TOPAMAX) 25 MG tablet Take 1 tablet (25 mg total) by mouth Nightly.  30 tablet  3   The PMH, PSH, Social History, Family History, Medications, and allergies have been reviewed in [facility], and have been updated if relevant.   Review of Systems    See HPI Objective:   Physical Exam BP 122/68  Pulse 80  Temp 98.1 F (36.7 C) (Oral)  Wt  199 lb 4 oz (90.379 kg)  LMP 11/06/2011  General:  Well-developed,well-nourished,in no acute distress; alert,appropriate and cooperative throughout examination Head:  normocephalic and atraumatic.   Eyes:  vision grossly intact, pupils equal, pupils round, and pupils reactive to light.   Ears:  R ear normal and L ear normal.   Nose:  no external deformity.   Mouth:  good dentition.   Lungs:  Normal respiratory effort, chest expands symmetrically. Lungs are clear to auscultation, no crackles or wheezes. Heart:  Normal rate and regular rhythm. S1 and S2 normal without gallop, murmur, click, rub or other extra sounds. Msk:  No deformity or scoliosis noted of thoracic or lumbar spine.   Extremities:  No clubbing, cyanosis, edema, or deformity noted with normal full range of motion of all joints.   Neurologic:  alert & oriented X3 and gait normal.   Skin:  Intact without suspicious lesions or rashes Psych:  Cognition and judgment appear intact. Alert and cooperative with normal attention span and concentration. No apparent delusions, illusions, hallucinations     Assessment & Plan:   1. Allergy to cats    New.  Discussed treatment options. She does not want to get rid of the cat or to make him an outdoor  cat. Will start antihistamines and flonase. If symptoms persist, refer to allergist. The patient indicates understanding of these issues and agrees with the plan.

## 2012-02-23 ENCOUNTER — Other Ambulatory Visit: Payer: Self-pay | Admitting: Family Medicine

## 2012-02-23 NOTE — Telephone Encounter (Signed)
Pt was last seen 8/12 for physical, since then only for acute visits, no upcoming appts scheduled.

## 2012-08-23 ENCOUNTER — Encounter: Payer: Self-pay | Admitting: Family Medicine

## 2012-08-23 ENCOUNTER — Ambulatory Visit (INDEPENDENT_AMBULATORY_CARE_PROVIDER_SITE_OTHER): Payer: 59 | Admitting: Family Medicine

## 2012-08-23 VITALS — BP 112/62 | HR 72 | Temp 98.2°F | Ht 67.5 in | Wt 182.0 lb

## 2012-08-23 DIAGNOSIS — R05 Cough: Secondary | ICD-10-CM

## 2012-08-23 MED ORDER — AZITHROMYCIN 250 MG PO TABS: ORAL_TABLET | ORAL | Status: DC

## 2012-08-23 MED ORDER — GUAIFENESIN-CODEINE 100-10 MG/5ML PO SYRP: 5.0000 mL | ORAL_SOLUTION | Freq: Every evening | ORAL | Status: DC | PRN

## 2012-08-23 NOTE — Assessment & Plan Note (Signed)
Post viral cough vs developing bronchitis. Treat with cheratussin cough syrup at night. Provided with [facility] script to cover atypical bronchitis, discussed fill if cough not improving. Pt agrees with plan. Update [place] if sxs persist.

## 2012-08-23 NOTE — Patient Instructions (Signed)
You either have a post viral cough or are developing bronchitis. Treat cough with cheratussin at night time. If not improved after next 1-2 days, fill zpack antibiotic (printed out today). Lots of fluids and rest. May try ibuprofen or advil to help with bronchial inflammation Let us know if not better with this.

## 2012-08-23 NOTE — Progress Notes (Signed)
  Subjective:    Patient ID: Gabrielle Hill, female    DOB: 08/26/1975, 37 y.o.   MRN: [id number]  HPI CC: cough  Cough started 10 days ago.  Associated with fever, HA.  Cough has persisted.  Coughing fits to point of gagging and having some urinary leakage.  Also gets diaphoretic with cough.  Dry nagging cough.  Initially with sore throat, coryza and rhinorrhea that has now resolved.  Possible PNdrainge.  Self treated with delsym and throat lozenges OTC.  Also using nyquil and mucinex.  Staying well hydrated.  Initially with fever but that has resolved.  No abd pain, nausea, ear or tooth pain.  No h/o asthma. No smokers at home. No sick contacts at home.  Now with sick contacts at work. No h/o spring allergies.  More trouble with fall - present with watery itchy eyes.  No past medical history on file.   Review of Systems Per HPI    Objective:   Physical Exam  Nursing note and vitals reviewed. Constitutional: She appears well-developed and well-nourished. No distress.  HENT:  Head: Normocephalic and atraumatic.  Right Ear: Hearing, tympanic membrane, external ear and ear canal normal.  Left Ear: Hearing, tympanic membrane, external ear and ear canal normal.  Nose: No mucosal edema or rhinorrhea.  Mouth/Throat: Uvula is midline and mucous membranes are normal. Posterior oropharyngeal edema present. No oropharyngeal exudate, posterior oropharyngeal erythema or tonsillar abscesses.  Swollen R tonsil  Eyes: Conjunctivae and EOM are normal. Pupils are equal, round, and reactive to light. No scleral icterus.  Neck: Normal range of motion. Neck supple.  Cardiovascular: Normal rate, regular rhythm, normal heart sounds and intact distal pulses.   No murmur heard. Pulmonary/Chest: Effort normal and breath sounds normal. No respiratory distress. She has no wheezes. She has no rales.  Lymphadenopathy:    She has no cervical adenopathy.  Skin: Skin is warm and dry. No rash noted.        Assessment & Plan:

## 2012-09-03 ENCOUNTER — Telehealth: Payer: Self-pay

## 2012-09-03 MED ORDER — GUAIFENESIN-CODEINE 100-10 MG/5ML PO SYRP: 5.0000 mL | ORAL_SOLUTION | Freq: Every evening | ORAL | Status: DC | PRN

## 2012-09-03 NOTE — Telephone Encounter (Signed)
Anticipate persistent post-infectious cough.  May do another course of cheratussin for cough if pt desires.  plz phone in. If not better with this, recommend come in for further evaluation.

## 2012-09-03 NOTE — Telephone Encounter (Signed)
Message left notifying patient. Rx called in as directed. Advised patient to call if any questions or if no better after taking cough syrup.

## 2012-09-03 NOTE — Telephone Encounter (Signed)
When pt finished z pack she felt better including cough(cough never went away but changed from gagging cough to now non productive cough that does not cause pt to gag but seems to be a deeper cough and pt has some pain on rt side of chest with tightness  when coughs. At times pt has some trouble getting breath.Pt wanted to know if med could be called in to [facility] or does pt have to make appt for f/u. Pt is out of Robitussin AC.Please advise.pt request call back.

## 2012-09-10 NOTE — Telephone Encounter (Signed)
Pt said continuing with non prod cough after finishing Z pack and codeine cough med. After refill on cheratussin  Pt cannot see improvement with cough and wants to know if needs to be rechecked. Pt also said she recently found out that her mother was treated for sarcoidosis. Pt scheduled appt with Dr Dayton Martes on 09/11/12 at 10 AM. Pt advised if condition changes or worsens prior to appt to call back or go to UC.

## 2012-09-11 ENCOUNTER — Ambulatory Visit (INDEPENDENT_AMBULATORY_CARE_PROVIDER_SITE_OTHER): Payer: 59 | Admitting: Family Medicine

## 2012-09-11 ENCOUNTER — Ambulatory Visit (INDEPENDENT_AMBULATORY_CARE_PROVIDER_SITE_OTHER)
Admission: RE | Admit: 2012-09-11 | Discharge: 2012-09-11 | Disposition: A | Discharge status: Home / Self Care | Payer: 59 | Source: Ambulatory Visit | Attending: Family Medicine | Admitting: Family Medicine

## 2012-09-11 ENCOUNTER — Encounter: Payer: Self-pay | Admitting: Family Medicine

## 2012-09-11 VITALS — BP 118/60 | HR 70 | Temp 98.0°F | Wt 184.0 lb

## 2012-09-11 DIAGNOSIS — R05 Cough: Secondary | ICD-10-CM

## 2012-09-11 MED ORDER — AZITHROMYCIN 250 MG PO TABS: ORAL_TABLET | ORAL | Status: DC

## 2012-09-11 MED ORDER — BENZONATATE 200 MG PO CAPS: 200.0000 mg | ORAL_CAPSULE | Freq: Two times a day (BID) | ORAL | Status: DC | PRN

## 2012-09-11 MED ORDER — HYDROCOD POLST-CHLORPHEN POLST 10-8 MG/5ML PO LQCR: 5.0000 mL | Freq: Every evening | ORAL | Status: DC | PRN

## 2012-09-11 NOTE — Patient Instructions (Addendum)
Good to see you. Congratulations on your engagement! I cannot wait to see pictures- you will be a gorgeous bride!  We will call you with your xray results.  You can take Tessalon during the day, Tussionex as needed at night.  Take the Zpack if not better by end of the week.

## 2012-09-11 NOTE — Progress Notes (Signed)
Subjective:    Patient ID: Gabrielle Hill, female    DOB: 1976/03/26, 37 y.o.   MRN: [id number]  HPI Very pleasant 37 yo female here for persistent cough.  Saw Dr. Reece Agar on 08/23/12 for cough.  Note reviewed.  At that time, had cough with fever and headache x 10 days.  Cough dry, initially started with typical URI viral symptoms- sore throat, runny nose but those had resolved.    Treated with Zpack and cheratussin for possible post viral bronchitis.  No h/o asthma. No smokers at home. No sick contacts at home.    Mother has sarcoidosis which is why cough is now so concerning for her.  Felt better after initial zpack but now cough continues and she feels weak again.  No fevers.  SOB when she is coughing otherwise no SOB or CP.  Patient Active Problem List  Diagnosis  . Migraine  . Routine general medical examination at a health care facility  . Chest pain  . Anxiety  . Screening for STD (sexually transmitted disease)  . Allergy to cats  . Cough   No past medical history on file. No past surgical history on file. History  Substance Use Topics  . Smoking status: Never Smoker   . Smokeless tobacco: Not on file  . Alcohol Use: Yes     Comment: Occasionally   Family History  Problem Relation Age of Onset  . Sarcoidosis Mother     s/p lung transplant, post op diabetes  . Arthritis Father   . Dementia Father   . Migraines Neg Hx    No Known Allergies Current Outpatient Prescriptions on File Prior to Visit  Medication Sig Dispense Refill  . Alum & Mag Hydroxide-Simeth (MAGIC MOUTHWASH W/LIDOCAINE) SOLN Take 15 mLs by mouth 4 (four) times daily as needed.  240 mL  0  . azithromycin (ZITHROMAX Z-PAK) 250 MG tablet Two on day 1 followed by one daily for 4 days for total of 5 days, PO  6 each  0  . guaiFENesin-codeine (ROBITUSSIN AC) 100-10 MG/5ML syrup Take 5 mLs by mouth at bedtime as needed for cough.  180 mL  0  . topiramate (TOPAMAX) 25 MG tablet Take 1 tablet (25 mg total)  by mouth Nightly.  30 tablet  3  . TRI-PREVIFEM 0.18/0.215/0.25 MG-35 MCG tablet TAKE 1 TABLET BY MOUTH DAILY.  28 tablet  11   No current facility-administered medications on file prior to visit.     The PMH, PSH, Social History, Family History, Medications, and allergies have been reviewed in [facility], and have been updated if relevant.  Review of Systems Per HPI    Objective:   Physical Exam  BP 118/60  Pulse 70  Temp(Src) 98 F (36.7 C)  Wt 184 lb (83.462 kg)  BMI 28.38 kg/m2  SpO2 98%  LMP 08/13/2012  Nursing note and vitals reviewed. Constitutional: She appears well-developed and well-nourished. No distress.  HENT:  Head: Normocephalic and atraumatic.  Right Ear: Hearing, tympanic membrane, external ear and ear canal normal.  Left Ear: Hearing, tympanic membrane, external ear and ear canal normal.  Eyes: Conjunctivae and EOM are normal. Pupils are equal, round, and reactive to light. No scleral icterus.  Neck: Normal range of motion. Neck supple.  Cardiovascular: Normal rate, regular rhythm, normal heart sounds and intact distal pulses.   No murmur heard. Pulmonary/Chest: Effort normal and breath sounds normal. No respiratory distress. She has no wheezes. She has no rales.  Lymphadenopathy:  She has no cervical adenopathy.  Skin: Skin is warm and dry. No rash noted.       Assessment & Plan:  1. Cough Persistent- likely lingering from post viral bronchitis.  Exam reassuring. Will treat with tessalon and tussionex as needed at night.  CXR today given duration of symptoms. Will repeat Zpack if symptoms persist beyond the end of the week. The patient indicates understanding of these issues and agrees with the plan. Call or return to clinic prn if these symptoms worsen or fail to improve as anticipated.  - DG Chest 2 View; Future

## 2013-02-13 ENCOUNTER — Other Ambulatory Visit: Payer: Self-pay | Admitting: Family Medicine

## 2013-03-05 ENCOUNTER — Encounter (HOSPITAL_COMMUNITY): Payer: Self-pay | Admitting: Emergency Medicine

## 2013-03-05 ENCOUNTER — Emergency Department (INDEPENDENT_AMBULATORY_CARE_PROVIDER_SITE_OTHER): Payer: 59

## 2013-03-05 ENCOUNTER — Emergency Department (INDEPENDENT_AMBULATORY_CARE_PROVIDER_SITE_OTHER)
Admission: EM | Admit: 2013-03-05 | Discharge: 2013-03-05 | Disposition: A | Discharge status: Home / Self Care | Payer: Self-pay | Source: Home / Self Care | Attending: Emergency Medicine | Admitting: Emergency Medicine

## 2013-03-05 DIAGNOSIS — S161XXA Strain of muscle, fascia and tendon at neck level, initial encounter: Secondary | ICD-10-CM

## 2013-03-05 DIAGNOSIS — S46012A Strain of muscle(s) and tendon(s) of the rotator cuff of left shoulder, initial encounter: Secondary | ICD-10-CM

## 2013-03-05 DIAGNOSIS — S139XXA Sprain of joints and ligaments of unspecified parts of neck, initial encounter: Secondary | ICD-10-CM

## 2013-03-05 DIAGNOSIS — S43429A Sprain of unspecified rotator cuff capsule, initial encounter: Secondary | ICD-10-CM

## 2013-03-05 MED ORDER — CYCLOBENZAPRINE HCL 10 MG PO TABS: 10.0000 mg | ORAL_TABLET | Freq: Three times a day (TID) | ORAL | Status: DC | PRN

## 2013-03-05 MED ORDER — HYDROCODONE-ACETAMINOPHEN 5-325 MG PO TABS
2.0000 | ORAL_TABLET | Freq: Once | ORAL | Status: AC
  Administered 2013-03-05: 2 via ORAL

## 2013-03-05 MED ORDER — OXYCODONE-ACETAMINOPHEN 5-325 MG PO TABS: ORAL_TABLET | ORAL | Status: DC

## 2013-03-05 MED ORDER — MELOXICAM 15 MG PO TABS: 15.0000 mg | ORAL_TABLET | Freq: Every day | ORAL | Status: DC
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
## 2013-04-10 ENCOUNTER — Encounter: Payer: Self-pay | Admitting: Internal Medicine

## 2013-04-10 ENCOUNTER — Ambulatory Visit (INDEPENDENT_AMBULATORY_CARE_PROVIDER_SITE_OTHER): Payer: 59 | Admitting: Internal Medicine

## 2013-04-10 VITALS — BP 112/68 | HR 83 | Temp 97.9°F | Wt 190.5 lb

## 2013-04-10 DIAGNOSIS — Z3480 Encounter for supervision of other normal pregnancy, unspecified trimester: Secondary | ICD-10-CM

## 2013-04-10 LAB — POCT URINE PREGNANCY: Preg Test, Ur: POSITIVE

## 2013-04-10 NOTE — Progress Notes (Signed)
Pre-visit discussion using our clinic review tool. No additional management support is needed unless otherwise documented below in the visit note.  

## 2013-04-10 NOTE — Patient Instructions (Signed)
Pregnancy - First Trimester  During sexual intercourse, millions of sperm go into the vagina. Only 1 sperm will penetrate and fertilize the female egg while it is in the Fallopian tube. One week later, the fertilized egg implants into the wall of the uterus. An embryo begins to develop into a baby. At 6 to 8 weeks, the eyes and face are formed and the heartbeat can be seen on ultrasound. At the end of 12 weeks (first trimester), all the baby's organs are formed. Now that you are pregnant, you will want to do everything you can to have a healthy baby. Two of the most important things are to get good prenatal care and follow your caregiver's instructions. Prenatal care is all the medical care you receive before the baby's birth. It is given to prevent, find, and treat problems during the pregnancy and childbirth.  PRENATAL EXAMS  · During prenatal visits, your weight, blood pressure, and urine are checked. This is done to make sure you are healthy and progressing normally during the pregnancy.  · A pregnant woman should gain 25 to 35 pounds during the pregnancy. However, if you are overweight or underweight, your caregiver will advise you regarding your weight.  · Your caregiver will ask and answer questions for you.  · Blood work, cervical cultures, other necessary tests, and a Pap test are done during your prenatal exams. These tests are done to check on your health and the probable health of your baby. Tests are strongly recommended and done for HIV with your permission. This is the virus that causes AIDS. These tests are done because medicines can be given to help prevent your baby from being born with this infection should you have been infected without knowing it. Blood work is also used to find out your blood type, previous infections, and follow your blood levels (hemoglobin).  · Low hemoglobin (anemia) is common during pregnancy. Iron and vitamins are given to help prevent this. Later in the pregnancy, blood  tests for diabetes will be done along with any other tests if any problems develop.  · You may need other tests to make sure you and the baby are doing well.  CHANGES DURING THE FIRST TRIMESTER   Your body goes through many changes during pregnancy. They vary from person to person. Talk to your caregiver about changes you notice and are concerned about. Changes can include:  · Your menstrual period stops.  · The egg and sperm carry the genes that determine what you look like. Genes from you and your partner are forming a baby. The female genes determine whether the baby is a boy or a girl.  · Your body increases in girth and you may feel bloated.  · Feeling sick to your stomach (nauseous) and throwing up (vomiting). If the vomiting is uncontrollable, call your caregiver.  · Your breasts will begin to enlarge and become tender.  · Your nipples may stick out more and become darker.  · The need to urinate more. Painful urination may mean you have a bladder infection.  · Tiring easily.  · Loss of appetite.  · Cravings for certain kinds of food.  · At first, you may gain or lose a couple of pounds.  · You may have changes in your emotions from day to day (excited to be pregnant or concerned something may go wrong with the pregnancy and baby).  · You may have more vivid and strange dreams.  HOME CARE INSTRUCTIONS   ·   It is very important to avoid all smoking, alcohol and non-prescribed drugs during your pregnancy. These affect the formation and growth of the baby. Avoid chemicals while pregnant to ensure the delivery of a healthy infant.  · Start your prenatal visits by the 12th week of pregnancy. They are usually scheduled monthly at first, then more often in the last 2 months before delivery. Keep your caregiver's appointments. Follow your caregiver's instructions regarding medicine use, blood and lab tests, exercise, and diet.  · During pregnancy, you are providing food for you and your baby. Eat regular, well-balanced  meals. Choose foods such as meat, fish, milk and other low fat dairy products, vegetables, fruits, and whole-grain breads and cereals. Your caregiver will tell you of the ideal weight gain.  · You can help morning sickness by keeping soda crackers at the bedside. Eat a couple before arising in the morning. You may want to use the crackers without salt on them.  · Eating 4 to 5 small meals rather than 3 large meals a day also may help the nausea and vomiting.  · Drinking liquids between meals instead of during meals also seems to help nausea and vomiting.  · A physical sexual relationship may be continued throughout pregnancy if there are no other problems. Problems may be early (premature) leaking of amniotic fluid from the membranes, vaginal bleeding, or belly (abdominal) pain.  · Exercise regularly if there are no restrictions. Check with your caregiver or physical therapist if you are unsure of the safety of some of your exercises. Greater weight gain will occur in the last 2 trimesters of pregnancy. Exercising will help:  · Control your weight.  · Keep you in shape.  · Prepare you for labor and delivery.  · Help you lose your pregnancy weight after you deliver your baby.  · Wear a good support or jogging bra for breast tenderness during pregnancy. This may help if worn during sleep too.  · Ask when prenatal classes are available. Begin classes when they are offered.  · Do not use hot tubs, steam rooms, or saunas.  · Wear your seat belt when driving. This protects you and your baby if you are in an accident.  · Avoid raw meat, uncooked cheese, cat litter boxes, and soil used by cats throughout the pregnancy. These carry germs that can cause birth defects in the baby.  · The first trimester is a good time to visit your dentist for your dental health. Getting your teeth cleaned is okay. Use a softer toothbrush and brush gently during pregnancy.  · Ask for help if you have financial, counseling, or nutritional needs  during pregnancy. Your caregiver will be able to offer counseling for these needs as well as refer you for other special needs.  · Do not take any medicines or herbs unless told by your caregiver.  · Inform your caregiver if there is any mental or physical domestic violence.  · Make a list of emergency phone numbers of family, friends, hospital, and police and fire departments.  · Write down your questions. Take them to your prenatal visit.  · Do not douche.  · Do not cross your legs.  · If you have to stand for long periods of time, rotate you feet or take small steps in a circle.  · You may have more vaginal secretions that may require a sanitary pad. Do not use tampons or scented sanitary pads.  MEDICINES AND DRUG USE IN PREGNANCY  ·   Take prenatal vitamins as directed. The vitamin should contain 1 milligram of folic acid. Keep all vitamins out of reach of children. Only a couple vitamins or tablets containing iron may be fatal to a baby or young child when ingested.  · Avoid use of all medicines, including herbs, over-the-counter medicines, not prescribed or suggested by your caregiver. Only take over-the-counter or prescription medicines for pain, discomfort, or fever as directed by your caregiver. Do not use aspirin, ibuprofen, or naproxen unless directed by your caregiver.  · Let your caregiver also know about herbs you may be using.  · Alcohol is related to a number of birth defects. This includes fetal alcohol syndrome. All alcohol, in any form, should be avoided completely. Smoking will cause low birth rate and premature babies.  · Street or illegal drugs are very harmful to the baby. They are absolutely forbidden. A baby born to an addicted mother will be addicted at birth. The baby will go through the same withdrawal an adult does.  · Let your caregiver know about any medicines that you have to take and for what reason you take them.  SEEK MEDICAL CARE IF:   You have any concerns or worries during your  pregnancy. It is better to call with your questions if you feel they cannot wait, rather than worry about them.  SEEK IMMEDIATE MEDICAL CARE IF:   · An unexplained oral temperature above 102° F (38.9° C) develops, or as your caregiver suggests.  · You have leaking of fluid from the vagina (birth canal). If leaking membranes are suspected, take your temperature and inform your caregiver of this when you call.  · There is vaginal spotting or bleeding. Notify your caregiver of the amount and how many pads are used.  · You develop a bad smelling vaginal discharge with a change in the color.  · You continue to feel sick to your stomach (nauseated) and have no relief from remedies suggested. You vomit blood or coffee ground-like materials.  · You lose more than 2 pounds of weight in 1 week.  · You gain more than 2 pounds of weight in 1 week and you notice swelling of your face, hands, feet, or legs.  · You gain 5 pounds or more in 1 week (even if you do not have swelling of your hands, face, legs, or feet).  · You get exposed to German measles and have never had them.  · You are exposed to fifth disease or chickenpox.  · You develop belly (abdominal) pain. Round ligament discomfort is a common non-cancerous (benign) cause of abdominal pain in pregnancy. Your caregiver still must evaluate this.  · You develop headache, fever, diarrhea, pain with urination, or shortness of breath.  · You fall or are in a car accident or have any kind of trauma.  · There is mental or physical violence in your home.  Document Released: 05/02/2001 Document Revised: 01/31/2012 Document Reviewed: 11/03/2008  [employer]® Patient Information ©2014 [employer].

## 2013-04-10 NOTE — Progress Notes (Signed)
Subjective:    Patient ID: Gabrielle Hill, female    DOB: [birth year], 37 y.o.   MRN: [id number]  HPI  Pt presents to the clinic today to confirm pregnancy. Her LMP was 02/21/2013. She has had breast tenderness and increased appetite.  Review of Systems      History reviewed. No pertinent past medical history.  No current outpatient prescriptions on file.   No current facility-administered medications for this visit.    No Known Allergies  Family History  Problem Relation Age of Onset  . Sarcoidosis Mother     s/p lung transplant, post op diabetes  . Arthritis Father   . Dementia Father   . Migraines Neg Hx     History   Social History  . Marital Status: Married    Spouse Name: N/A    Number of Children: 1  . Years of Education: N/A   Occupational History  . journalist    Social History Main Topics  . Smoking status: Never Smoker   . Smokeless tobacco: Not on file  . Alcohol Use: Yes     Comment: Occasionally  . Drug Use: No  . Sexual Activity: Not on file   Other Topics Concern  . Not on file   Social History Narrative   G1P1, daughter.  Uncomplicated pregnancy and delivery.   Regular exercise.     Lives in [place]   No surgeries.     Constitutional: Denies fever, malaise, fatigue, headache or abrupt weight changes.   GU: Denies urgency, frequency, pain with urination, burning sensation, blood in urine, odor or discharge. Musculoskeletal: Denies decrease in range of motion, difficulty with gait, muscle pain or joint pain and swelling.    No other specific complaints in a complete review of systems (except as listed in HPI above).  Objective:   Physical Exam  BP 112/68  Pulse 83  Temp(Src) 97.9 F (36.6 C) (Oral)  Wt 190 lb 8 oz (86.41 kg)  SpO2 98%  LMP 03/03/2013 Wt Readings from Last 3 Encounters:  04/10/13 190 lb 8 oz (86.41 kg)  09/11/12 184 lb (83.462 kg)  08/23/12 182 lb (82.555 kg)    General: Appears her  stated age, well  developed, well nourished in NAD. Cardiovascular: Normal rate and rhythm. S1,S2 noted.  No murmur, rubs or gallops noted. No JVD or BLE edema. No carotid bruits noted. Pulmonary/Chest: Normal effort and positive vesicular breath sounds. No respiratory distress. No wheezes, rales or ronchi noted.   BMET    Component Value Date/Time   NA 135 03/21/2011 1217   K 3.8 03/21/2011 1217   CL 104 03/21/2011 1217   CO2 24 03/21/2011 1217   GLUCOSE 89 03/21/2011 1217   BUN 7 03/21/2011 1217   CREATININE 0.5 03/21/2011 1217   CREATININE 0.63 12/23/2010 1635   CALCIUM 9.3 03/21/2011 1217   GFRNONAA 138.15 02/10/2010 1420   GFRAA  Value: >60        The eGFR has been calculated using the MDRD equation. This calculation has not been validated in all clinical situations. eGFR's persistently <60 mL/min signify possible Chronic Kidney Disease. 12/20/2009 1550    Lipid Panel     Component Value Date/Time   CHOL 143 12/23/2010 1635   TRIG 55 12/23/2010 1635   HDL 57 12/23/2010 1635   CHOLHDL 2.5 12/23/2010 1635   VLDL 11 12/23/2010 1635   LDLCALC 75 12/23/2010 1635    CBC    Component Value Date/Time   WBC 7.6  03/21/2011 1217   RBC 4.27 03/21/2011 1217   HGB 12.0 03/21/2011 1217   HCT 36.9 03/21/2011 1217   PLT 175.0 03/21/2011 1217   MCV 86.4 03/21/2011 1217   MCH 27.6 12/20/2009 1550   MCHC 32.6 03/21/2011 1217   RDW 13.1 03/21/2011 1217   LYMPHSABS 3.2 03/21/2011 1217   MONOABS 0.4 03/21/2011 1217   EOSABS 0.0 03/21/2011 1217   BASOSABS 0.0 03/21/2011 1217    Hgb A1C No results found for this basename: HGBA1C         Assessment & Plan:   Pregnancy:  Urine Hcg positive Congratulations Pt will make an appt with OB Advised pt to not take any advil, aspirin etc  RTC as needed

## 2013-05-22 NOTE — H&P (Addendum)
38 yo G1P0 with missed Ab presents for surgical mngt.  By dates 11 wks, 8 weeks by US with no FHT.  PMHx:  Neg PSHx:  Neg All:  NKDA Meds:  PNV SHX:  Negative tobacco  AF, VSS Gen - NAD Abd - soft, NT CV - RRR Lungs - clear Ext - NT PV - deferred  US - IUP measuring 8 wks, no FHT  A/P:  Missed Ab D&E R/b/a discussed, questions answered, informed consent

## 2013-05-23 ENCOUNTER — Encounter (HOSPITAL_COMMUNITY): Payer: Self-pay | Admitting: *Deleted

## 2013-05-23 ENCOUNTER — Encounter (HOSPITAL_COMMUNITY)
Admission: RE | Disposition: A | Discharge status: Home / Self Care | Payer: Self-pay | Source: Ambulatory Visit | Attending: Obstetrics and Gynecology

## 2013-05-23 ENCOUNTER — Encounter (HOSPITAL_COMMUNITY): Payer: 59 | Admitting: Anesthesiology

## 2013-05-23 ENCOUNTER — Ambulatory Visit (HOSPITAL_COMMUNITY)
Admission: RE | Admit: 2013-05-23 | Discharge: 2013-05-23 | Disposition: A | Discharge status: Home / Self Care | Payer: 59 | Source: Ambulatory Visit | Attending: Obstetrics and Gynecology | Admitting: Obstetrics and Gynecology

## 2013-05-23 ENCOUNTER — Ambulatory Visit (HOSPITAL_COMMUNITY): Payer: 59 | Admitting: Anesthesiology

## 2013-05-23 ENCOUNTER — Encounter (HOSPITAL_COMMUNITY): Payer: Self-pay | Admitting: Pharmacist

## 2013-05-23 DIAGNOSIS — O021 Missed abortion: Secondary | ICD-10-CM | POA: Insufficient documentation

## 2013-05-23 HISTORY — PX: DILATION AND EVACUATION: SHX1459

## 2013-05-23 LAB — CBC
HCT: 36.4 % (ref 36.0–46.0)
Hemoglobin: 12.2 g/dL (ref 12.0–15.0)
MCH: 27.7 pg (ref 26.0–34.0)
MCHC: 33.5 g/dL (ref 30.0–36.0)
MCV: 82.7 fL (ref 78.0–100.0)
PLATELETS: 181 10*3/uL (ref 150–400)
RBC: 4.4 MIL/uL (ref 3.87–5.11)
RDW: 12.6 % (ref 11.5–15.5)
WBC: 6.5 10*3/uL (ref 4.0–10.5)

## 2013-05-23 SURGERY — DILATION AND EVACUATION, UTERUS
Anesthesia: Monitor Anesthesia Care | Site: Vagina

## 2013-05-23 MED ORDER — FENTANYL CITRATE 0.05 MG/ML IJ SOLN
INTRAMUSCULAR | Status: AC
  Filled 2013-05-23: qty 2

## 2013-05-23 MED ORDER — METHYLERGONOVINE MALEATE 0.2 MG PO TABS: 0.2000 mg | ORAL_TABLET | Freq: Three times a day (TID) | ORAL | Status: DC

## 2013-05-23 MED ORDER — LIDOCAINE HCL (CARDIAC) 20 MG/ML IV SOLN
INTRAVENOUS | Status: DC | PRN
  Administered 2013-05-23: 50 mg via INTRAVENOUS

## 2013-05-23 MED ORDER — CHLOROPROCAINE HCL 1 % IJ SOLN
INTRAMUSCULAR | Status: DC | PRN
  Administered 2013-05-23: 10 mL

## 2013-05-23 MED ORDER — ONDANSETRON HCL 4 MG/2ML IJ SOLN
INTRAMUSCULAR | Status: DC | PRN
  Administered 2013-05-23: 4 mg via INTRAVENOUS

## 2013-05-23 MED ORDER — LACTATED RINGERS IV SOLN
INTRAVENOUS | Status: DC
  Administered 2013-05-23 (×2): via INTRAVENOUS

## 2013-05-23 MED ORDER — METOCLOPRAMIDE HCL 5 MG/ML IJ SOLN: 10.0000 mg | Freq: Once | INTRAMUSCULAR | Status: DC | PRN

## 2013-05-23 MED ORDER — PROPOFOL 10 MG/ML IV BOLUS
INTRAVENOUS | Status: DC | PRN
  Administered 2013-05-23 (×3): 20 mg via INTRAVENOUS

## 2013-05-23 MED ORDER — ONDANSETRON HCL 4 MG/2ML IJ SOLN
INTRAMUSCULAR | Status: AC
  Filled 2013-05-23: qty 2

## 2013-05-23 MED ORDER — MIDAZOLAM HCL 2 MG/2ML IJ SOLN
INTRAMUSCULAR | Status: AC
  Filled 2013-05-23: qty 2

## 2013-05-23 MED ORDER — OXYCODONE-ACETAMINOPHEN 5-325 MG PO TABS: 1.0000 | ORAL_TABLET | ORAL | Status: DC | PRN

## 2013-05-23 MED ORDER — CHLOROPROCAINE HCL 1 % IJ SOLN
INTRAMUSCULAR | Status: AC
  Filled 2013-05-23: qty 30

## 2013-05-23 MED ORDER — KETOROLAC TROMETHAMINE 30 MG/ML IJ SOLN: 15.0000 mg | Freq: Once | INTRAMUSCULAR | Status: DC | PRN

## 2013-05-23 MED ORDER — LIDOCAINE HCL (CARDIAC) 20 MG/ML IV SOLN
INTRAVENOUS | Status: AC
  Filled 2013-05-23: qty 5

## 2013-05-23 MED ORDER — FENTANYL CITRATE 0.05 MG/ML IJ SOLN
INTRAMUSCULAR | Status: DC | PRN
  Administered 2013-05-23: 100 ug via INTRAVENOUS

## 2013-05-23 MED ORDER — FENTANYL CITRATE 0.05 MG/ML IJ SOLN
25.0000 ug | INTRAMUSCULAR | Status: DC | PRN
  Administered 2013-05-23: 50 ug via INTRAVENOUS

## 2013-05-23 MED ORDER — IBUPROFEN 600 MG PO TABS: 600.0000 mg | ORAL_TABLET | Freq: Four times a day (QID) | ORAL | Status: DC | PRN

## 2013-05-23 MED ORDER — MEPERIDINE HCL 25 MG/ML IJ SOLN: 6.2500 mg | INTRAMUSCULAR | Status: DC | PRN

## 2013-05-23 MED ORDER — PROPOFOL 10 MG/ML IV EMUL
INTRAVENOUS | Status: AC
  Filled 2013-05-23: qty 20

## 2013-05-23 MED ORDER — MIDAZOLAM HCL 2 MG/2ML IJ SOLN
INTRAMUSCULAR | Status: DC | PRN
  Administered 2013-05-23: 2 mg via INTRAVENOUS

## 2013-05-23 MED ORDER — DEXTROSE 5 % IV SOLN
2.0000 g | INTRAVENOUS | Status: AC
  Administered 2013-05-23: 2 g via INTRAVENOUS
  Filled 2013-05-23: qty 2

## 2013-05-23 MED ORDER — DEXAMETHASONE SODIUM PHOSPHATE 10 MG/ML IJ SOLN
INTRAMUSCULAR | Status: DC | PRN
Start: 2013-05-23 — End: 2013-05-23
  Administered 2013-05-23: 10 mg via INTRAVENOUS

## 2013-05-23 MED ORDER — KETOROLAC TROMETHAMINE 30 MG/ML IJ SOLN
INTRAMUSCULAR | Status: DC | PRN
  Administered 2013-05-23: 30 mg via INTRAVENOUS

## 2013-05-23 SURGICAL SUPPLY — 21 items
CATH ROBINSON RED A/P 16FR (CATHETERS) ×3 IMPLANT
CLOTH BEACON ORANGE TIMEOUT ST (SAFETY) ×3 IMPLANT
DECANTER SPIKE VIAL GLASS SM (MISCELLANEOUS) ×3 IMPLANT
GLOVE BIO SURGEON STRL SZ 6.5 (GLOVE) ×2 IMPLANT
GLOVE BIO SURGEONS STRL SZ 6.5 (GLOVE) ×1
GLOVE BIOGEL PI IND STRL 7.0 (GLOVE) ×1 IMPLANT
GLOVE BIOGEL PI INDICATOR 7.0 (GLOVE) ×2
GOWN STRL REIN XL XLG (GOWN DISPOSABLE) ×6 IMPLANT
KIT BERKELEY 1ST TRIMESTER 3/8 (MISCELLANEOUS) ×3 IMPLANT
NEEDLE SPNL 22GX3.5 QUINCKE BK (NEEDLE) ×3 IMPLANT
NS IRRIG 1000ML POUR BTL (IV SOLUTION) ×3 IMPLANT
PACK VAGINAL MINOR WOMEN LF (CUSTOM PROCEDURE TRAY) ×3 IMPLANT
PAD OB MATERNITY 4.3X12.25 (PERSONAL CARE ITEMS) ×3 IMPLANT
PAD PREP 24X48 CUFFED NSTRL (MISCELLANEOUS) ×3 IMPLANT
SET BERKELEY SUCTION TUBING (SUCTIONS) ×3 IMPLANT
SYR CONTROL 10ML LL (SYRINGE) ×3 IMPLANT
TOWEL OR 17X24 6PK STRL BLUE (TOWEL DISPOSABLE) ×6 IMPLANT
VACURETTE 10 RIGID CVD (CANNULA) IMPLANT
VACURETTE 7MM CVD STRL WRAP (CANNULA) IMPLANT
VACURETTE 8 RIGID CVD (CANNULA) IMPLANT
VACURETTE 9 RIGID CVD (CANNULA) ×3 IMPLANT

## 2013-05-23 NOTE — Op Note (Signed)
[place]:  Pope, I[place]na               ACCOUNT NO.:  [id number]  MEDICAL RECORD NO.:  [id number]  LOCATION:  [facility]                          FACILITY:  [facility]  PHYSICIAN:  Zelphia CairoGretchen Glenmore Karl, MD    DATE OF BIRTH:  Apr 12, 1976  DATE OF PROCEDURE:  05/23/2013 DATE OF DISCHARGE:                              OPERATIVE REPORT   PREOPERATIVE DIAGNOSIS:  Missed abortion.  POSTOPERATIVE DIAGNOSIS:  Missed abortion.  PROCEDURE: 1. Cervical block 2. Dilation and evacuation.  SURGEON:  Zelphia CairoGretchen Ilaria Much, MD.  ANESTHESIA:  MAC and local.  SPECIMEN:  Products of conception.  COMPLICATIONS:  None.  CONDITION:  Stable to recovery room.  DESCRIPTION OF PROCEDURE:  The patient was taken to the operating room. After informed consent was obtained, she was given anesthesia and placed in the dorsal lithotomy position using Allen stirrups.  She was prepped and draped in sterile fashion.  An in and out catheter was used to drain her bladder for an unmeasured amount of urine.  Bivalve speculum was placed in the vagina and 1 mL of 1% Nesacaine was used to provide local anesthesia at the anterior lip of the cervix.  Single-tooth tenaculum was attached to the anterior lip of the cervix and a cervical block was performed using the remaining 9 mL of 1% Nesacaine.  The cervix was then serially dilated using Pratt dilators, and a 9-French suction catheter was inserted and products of conception were evacuated from the intrauterine cavity.  A gentle curetting was performed until uterine cry was noted throughout and no further products of conception were removed. Once no further tissue was felt or obtained, the tenaculum was removed from the cervix.  The cervix was hemostatic.  Speculum was removed.  The patient was taken to the recovery room in stable condition.  Sponge, lap, needle, and instrument counts were correct x2.    Zelphia CairoGretchen Jenia Klepper, MD    GA/MEDQ  D:  05/23/2013  T:  05/23/2013  Job:  [id number]

## 2013-05-23 NOTE — Discharge Instructions (Signed)
[facility] office 2-3 weeks for postop appointment.  Call the office [phone] for an appointment. ° °Personal Hygiene: °Use pads not tampons x 1week °You may shower, no tub baths or pools for 2-3 weeks °Wipe from front to back when using restroom ° °Activity: °Do not drive or operate any equipment for 24 hrs.   °Do not rest in bed all day °Walking is encouraged °Walk up and down stairs slowly °You may return to your normal activity in 1-2 days ° °Sexual Activity:  No intercourse for 2 weeks after the procedure. ° °Diet: Eat a light meal as desired this evening.  You may resume your usual diet tomorrow. ° °Return to work:  You may resume your work activities after 1-2 days ° °What to expect:  Expect to have vaginal bleeding/discharge for 2-3 days and spotting for 10-14 days.  It is not unusual to have soreness for 1-2 weeks.  You may have a slight burning sensation when you urinate for the first few days.  You may start your menses in 2-6 weeks.  Mild cramps may continue for a couple of days.   ° °Call your doctor:   °Excessive bleeding, saturating a pad every hour °Inability to urinate 6 hours after discharge °Pain not relieved with pain medications °Fever of 100.4 or greater ° °

## 2013-05-23 NOTE — Anesthesia Postprocedure Evaluation (Signed)
  Anesthesia Post-op Note  Anesthesia Post Note  Patient: Gabrielle Hill M Neu  Procedure(s) Performed: Procedure(s) (LRB): DILATATION AND EVACUATION (N/A)  Anesthesia type: MAC  Patient location: PACU  Post pain: Pain level controlled  Post assessment: Post-op Vital signs reviewed  Last Vitals:  Filed Vitals:   05/23/13 1530  BP: 114/64  Pulse: 72  Temp: 36.9 C  Resp: 12    Post vital signs: Reviewed  Level of consciousness: sedated  Complications: No apparent anesthesia complications

## 2013-05-23 NOTE — Transfer of Care (Signed)
Immediate Anesthesia Transfer of Care Note  Patient: Gabrielle Hill  Procedure(s) Performed: Procedure(s): DILATATION AND EVACUATION (N/A)  Patient Location: PACU  Anesthesia Type:MAC  Level of Consciousness: sedated  Airway & Oxygen Therapy: Patient Spontanous Breathing  Post-op Assessment: Report given to PACU RN  Post vital signs: Reviewed and stable  Complications: No apparent anesthesia complications

## 2013-05-23 NOTE — Anesthesia Preprocedure Evaluation (Signed)
Anesthesia Evaluation  Patient identified by MRN, date of birth, ID band Patient awake    Reviewed: Allergy & Precautions, H&P , NPO status , Patient's Chart, lab work & pertinent test results, reviewed documented beta blocker date and time   History of Anesthesia Complications Negative for: history of anesthetic complications  Airway       Dental  (+) Teeth Intact   Pulmonary neg pulmonary ROS,  breath sounds clear to auscultation        Cardiovascular negative cardio ROS  Rhythm:regular Rate:Normal     Neuro/Psych  Headaches, PSYCHIATRIC DISORDERS (anxiety)    GI/Hepatic negative GI ROS, Neg liver ROS,   Endo/Other  negative endocrine ROS  Renal/GU negative Renal ROS  negative genitourinary   Musculoskeletal   Abdominal   Peds  Hematology negative hematology ROS (+)   Anesthesia Other Findings   Reproductive/Obstetrics (+) Pregnancy (missed ab at 8 weeks)                           Anesthesia Physical Anesthesia Plan  ASA: II  Anesthesia Plan: MAC   Post-op Pain Management:    Induction:   Airway Management Planned:   Additional Equipment:   Intra-op Plan:   Post-operative Plan:   Informed Consent: I have reviewed the patients History and Physical, chart, labs and discussed the procedure including the risks, benefits and alternatives for the proposed anesthesia with the patient or authorized representative who has indicated his/her understanding and acceptance.     Plan Discussed with: Surgeon and CRNA  Anesthesia Plan Comments:         Anesthesia Quick Evaluation

## 2013-05-26 ENCOUNTER — Encounter (HOSPITAL_COMMUNITY): Payer: Self-pay | Admitting: Obstetrics and Gynecology

## 2013-11-11 LAB — OB RESULTS CONSOLE ANTIBODY SCREEN: ANTIBODY SCREEN: NEGATIVE

## 2013-11-11 LAB — OB RESULTS CONSOLE GC/CHLAMYDIA
Chlamydia: NEGATIVE
Gonorrhea: NEGATIVE

## 2013-11-11 LAB — OB RESULTS CONSOLE HEPATITIS B SURFACE ANTIGEN: Hepatitis B Surface Ag: NEGATIVE

## 2013-11-11 LAB — OB RESULTS CONSOLE ABO/RH: RH Type: POSITIVE

## 2013-11-11 LAB — OB RESULTS CONSOLE HIV ANTIBODY (ROUTINE TESTING): HIV: NONREACTIVE

## 2013-11-11 LAB — OB RESULTS CONSOLE RPR: RPR: NONREACTIVE

## 2013-11-11 LAB — OB RESULTS CONSOLE RUBELLA ANTIBODY, IGM: RUBELLA: IMMUNE

## 2014-05-22 NOTE — L&D Delivery Note (Signed)
Operative Delivery Note At 1:48 PM a viable female was delivered via .  Presentation: vertex; Position: Right,, Occiput,, Anterior; Station: +5.  Delivery of the head:   ,  McRobert's and suprapubic pressure Second maneuver:Corkscrew ,   Third maneuver:  Trial of delivery of posterior shoulder (unsuccessful) Fourth maneuver: Corkscrew again with successful delivery   APGAR: 7,9 ; weight  .   Placenta status:delivered intact     Anesthesia: Epidural  Episiotomy:  none Lacerations:  2nd degree Suture Repair: 3.0 vicryl Est. Blood Loss (mL):  300 cc  Mom to postpartum.  Baby to Couplet care / Skin to Skin.  Purcell NailsROBERTS,Sriya Kroeze Y 07/10/2014, 2:12 PM

## 2014-06-08 LAB — OB RESULTS CONSOLE GBS: GBS: NEGATIVE

## 2014-06-30 ENCOUNTER — Inpatient Hospital Stay (HOSPITAL_COMMUNITY)
Admission: AD | Admit: 2014-06-30 | Discharge: 2014-06-30 | Disposition: A | Discharge status: Home / Self Care | Payer: BLUE CROSS/BLUE SHIELD | Source: Ambulatory Visit | Attending: Obstetrics and Gynecology | Admitting: Obstetrics and Gynecology

## 2014-06-30 ENCOUNTER — Encounter (HOSPITAL_COMMUNITY): Payer: Self-pay | Admitting: *Deleted

## 2014-06-30 DIAGNOSIS — O09523 Supervision of elderly multigravida, third trimester: Secondary | ICD-10-CM | POA: Diagnosis not present

## 2014-06-30 DIAGNOSIS — O2620 Pregnancy care for patient with recurrent pregnancy loss, unspecified trimester: Secondary | ICD-10-CM | POA: Diagnosis present

## 2014-06-30 DIAGNOSIS — Z3A38 38 weeks gestation of pregnancy: Secondary | ICD-10-CM | POA: Diagnosis not present

## 2014-06-30 DIAGNOSIS — R030 Elevated blood-pressure reading, without diagnosis of hypertension: Secondary | ICD-10-CM | POA: Diagnosis present

## 2014-06-30 DIAGNOSIS — O09899 Supervision of other high risk pregnancies, unspecified trimester: Secondary | ICD-10-CM

## 2014-06-30 DIAGNOSIS — Z9889 Other specified postprocedural states: Secondary | ICD-10-CM

## 2014-06-30 DIAGNOSIS — R600 Localized edema: Secondary | ICD-10-CM | POA: Diagnosis present

## 2014-06-30 DIAGNOSIS — O1203 Gestational edema, third trimester: Secondary | ICD-10-CM | POA: Diagnosis not present

## 2014-06-30 DIAGNOSIS — O09529 Supervision of elderly multigravida, unspecified trimester: Secondary | ICD-10-CM

## 2014-06-30 DIAGNOSIS — O3440 Maternal care for other abnormalities of cervix, unspecified trimester: Secondary | ICD-10-CM

## 2014-06-30 LAB — COMPREHENSIVE METABOLIC PANEL
ALBUMIN: 3.2 g/dL — AB (ref 3.5–5.2)
ALT: 27 U/L (ref 0–35)
ANION GAP: 7 (ref 5–15)
AST: 28 U/L (ref 0–37)
Alkaline Phosphatase: 188 U/L — ABNORMAL HIGH (ref 39–117)
BILIRUBIN TOTAL: 0.1 mg/dL — AB (ref 0.3–1.2)
BUN: 10 mg/dL (ref 6–23)
CHLORIDE: 105 mmol/L (ref 96–112)
CO2: 22 mmol/L (ref 19–32)
Calcium: 9 mg/dL (ref 8.4–10.5)
Creatinine, Ser: 0.51 mg/dL (ref 0.50–1.10)
GFR calc Af Amer: >90 mL/min (ref >90)
GFR calc non Af Amer: >90 mL/min (ref >90)
GLUCOSE: 83 mg/dL (ref 70–99)
Potassium: 3.7 mmol/L (ref 3.5–5.1)
Sodium: 134 mmol/L — ABNORMAL LOW (ref 135–145)
TOTAL PROTEIN: 7.4 g/dL (ref 6.0–8.3)

## 2014-06-30 LAB — CBC
HCT: 34 % — ABNORMAL LOW (ref 36.0–46.0)
Hemoglobin: 11.1 g/dL — ABNORMAL LOW (ref 12.0–15.0)
MCH: 28 pg (ref 26.0–34.0)
MCHC: 32.6 g/dL (ref 30.0–36.0)
MCV: 85.9 fL (ref 78.0–100.0)
PLATELETS: 158 10*3/uL (ref 150–400)
RBC: 3.96 MIL/uL (ref 3.87–5.11)
RDW: 13 % (ref 11.5–15.5)
WBC: 12.4 10*3/uL — ABNORMAL HIGH (ref 4.0–10.5)

## 2014-06-30 LAB — PROTEIN / CREATININE RATIO, URINE
Creatinine, Urine: 169 mg/dL
PROTEIN CREATININE RATIO: 0.19 — AB (ref 0.00–0.15)
TOTAL PROTEIN, URINE: 32 mg/dL

## 2014-06-30 LAB — URIC ACID: Uric Acid, Serum: 3.1 mg/dL (ref 2.4–7.0)

## 2014-06-30 LAB — LACTATE DEHYDROGENASE: LDH: 167 U/L (ref 94–250)

## 2014-06-30 MED ORDER — ACETAMINOPHEN 325 MG PO TABS
650.0000 mg | ORAL_TABLET | Freq: Once | ORAL | Status: AC
  Administered 2014-06-30: 650 mg via ORAL
  Filled 2014-06-30: qty 2

## 2014-06-30 NOTE — MAU Provider Note (Signed)
History   39 yo G4P1021 at 4238 4/7 weeks presented from office for BP evaluation--felt "off" yesterday, home from work, with HA last night that resolved with Tylenol.  Awoke this am with HA again, mild visual blurriness, no epigastric pain.  Reports +FM, denies leaking, bleeding, or UCs.  Hasn't taken any Tylenol today.  Severe pedal edema, beginning at 35 weeks.  Seen at office with BP 150/60, 150/90--sent for PIH evaluation.  Hx gestational hypertension with 1st pregnancy, induced at 40 weeks, no meds.  Has had migraines previously.    Patient Active Problem List   Diagnosis Date Noted  . Pedal edema 06/30/2014  . Advanced maternal age in multigravida 06/30/2014  . 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 H/O cryosurgery of cervix complicating pregnancy 06/30/2014  . Prior pregnancy complicated by [facility], antepartum 06/30/2014  . Anxiety 03/21/2011  . Migraine 12/07/2010    Chief Complaint  Patient presents with  . Hypertension   HPI:  As above  OB History    Gravida Para Term Preterm AB TAB SAB Ectopic Multiple Living   4 1   1  1   1       History reviewed. No pertinent past medical history.  Past Surgical History  Procedure Laterality Date  . Dilation and evacuation N/A 05/23/2013    Procedure: DILATATION AND EVACUATION;  Surgeon: Zelphia CairoGretchen Adkins, MD;  Location: [facility];  Service: Gynecology;  Laterality: N/A;    Family History  Problem Relation Age of Onset  . Sarcoidosis Mother     s/p lung transplant, post op diabetes  . Arthritis Father   . Dementia Father   . Migraines Neg Hx   . Drug abuse Maternal Aunt   . Drug abuse Maternal Uncle   . Cancer Maternal Grandmother   . Cancer Paternal Grandmother     History  Substance Use Topics  . Smoking status: Never Smoker   . Smokeless tobacco: Not on file  . Alcohol Use: Yes     Comment: Occasionally    Allergies: No Known Allergies  No prescriptions prior to  admission    ROS:  Pedal edema, mild HA, +FM Physical Exam   Blood pressure 148/80, pulse 98, resp. rate 18. Filed Vitals:   06/30/14 1637 06/30/14 1647 06/30/14 1836 06/30/14 1853  BP: 133/87 137/86 148/80 148/80  Pulse: 119 106 98   Resp:   16 18    Physical Exam  In NAD Chest clear Heart RRR without murmur Abd gravid, NT Pelvic--deferred Ext DTR 1+, no clonus, 2-3+ edema in LE bilaterally  FHR Category 1 UCs occasional, mild  ED Course  Assessment: IUP at 38 4/7 weeks Pedal edema Hx gestational hypertension  Plan: Serial BPs PIH labs PCR NST   [name], Freedom Lopezperez CNM, MSN 06/30/2014 6:56 PM  Addendum: Denies HA, visual sx, or epigastric pain.  Filed Vitals:   06/30/14 1637 06/30/14 1647 06/30/14 1836 06/30/14 1853  BP: 133/87 137/86 148/80 148/80  Pulse: 119 106 98   Resp:   16 18   BP range in MAU:  126-143/74-88  Results for orders placed or performed during the hospital encounter of 06/30/14 (from the past 24 hour(s))  Protein / creatinine ratio, urine     Status: Abnormal   Collection Time: 06/30/14  3:20 PM  Result Value Ref Range   Creatinine, Urine 169.00 mg/dL   Total Protein, Urine 32 mg/dL   Protein Creatinine Ratio 0.19 (H) 0.00 - 0.15  CBC     Status: Abnormal   Collection Time: 06/30/14  3:35 PM  Result Value Ref Range   WBC 12.4 (H) 4.0 - 10.5 K/uL   RBC 3.96 3.87 - 5.11 MIL/uL   Hemoglobin 11.1 (L) 12.0 - 15.0 g/dL   HCT 81.1 (L) 91.4 - 78.2 %   MCV 85.9 78.0 - 100.0 fL   MCH 28.0 26.0 - 34.0 pg   MCHC 32.6 30.0 - 36.0 g/dL   RDW 95.6 21.3 - 08.6 %   Platelets 158 150 - 400 K/uL  Comprehensive metabolic panel     Status: Abnormal   Collection Time: 06/30/14  3:35 PM  Result Value Ref Range   Sodium 134 (L) 135 - 145 mmol/L   Potassium 3.7 3.5 - 5.1 mmol/L   Chloride 105 96 - 112 mmol/L   CO2 22 19 - 32 mmol/L   Glucose, Bld 83 70 - 99 mg/dL   BUN 10 6 - 23 mg/dL   Creatinine, Ser 5.78 0.50 - 1.10 mg/dL   Calcium 9.0 8.4 - 46.9  mg/dL   Total Protein 7.4 6.0 - 8.3 g/dL   Albumin 3.2 (L) 3.5 - 5.2 g/dL   AST 28 0 - 37 U/L   ALT 27 0 - 35 U/L   Alkaline Phosphatase 188 (H) 39 - 117 U/L   Total Bilirubin 0.1 (L) 0.3 - 1.2 mg/dL   GFR calc non Af Amer >90 >90 mL/min   GFR calc Af Amer >90 >90 mL/min   Anion gap 7 5 - 15  Lactate dehydrogenase     Status: None   Collection Time: 06/30/14  3:35 PM  Result Value Ref Range   LDH 167 94 - 250 U/L  Uric acid     Status: None   Collection Time: 06/30/14  3:35 PM  Result Value Ref Range   Uric Acid, Serum 3.1 2.4 - 7.0 mg/dL   Platelets 629 at NOB, 200 at glucola.  Dr. Normand Sloop called to check on patient. D/c'd home with PIH precautions. Now OOW due to severe pedal edema. Return to office on Friday, 07/03/14, for NST and ROB for BP recheck.   To return with any increase in sx, decreased FM, leaking, bleeding, or s/s labor.  Nigel Bridgeman, CNM 06/30/14 6:55p  Nigel Bridgeman, CNM

## 2014-06-30 NOTE — Progress Notes (Signed)
Pt states she has a history of a yeast infection

## 2014-06-30 NOTE — MAU Note (Signed)
Pt states she was at work and was not feeling well and came home and got some rest and felt better after working.pt states she had a headache last night and today. Pt rates headache 6 at this time

## 2014-06-30 NOTE — Discharge Instructions (Signed)

## 2014-07-09 ENCOUNTER — Inpatient Hospital Stay (HOSPITAL_COMMUNITY)
Admission: AD | Admit: 2014-07-09 | Discharge: 2014-07-12 | DRG: 775 | Disposition: A | Discharge status: Home / Self Care | Payer: BLUE CROSS/BLUE SHIELD | Source: Ambulatory Visit | Attending: Obstetrics and Gynecology | Admitting: Obstetrics and Gynecology

## 2014-07-09 ENCOUNTER — Encounter (HOSPITAL_COMMUNITY): Payer: Self-pay | Admitting: *Deleted

## 2014-07-09 DIAGNOSIS — O368130 Decreased fetal movements, third trimester, not applicable or unspecified: Secondary | ICD-10-CM | POA: Diagnosis present

## 2014-07-09 DIAGNOSIS — O133 Gestational [pregnancy-induced] hypertension without significant proteinuria, third trimester: Principal | ICD-10-CM | POA: Diagnosis present

## 2014-07-09 DIAGNOSIS — R109 Unspecified abdominal pain: Secondary | ICD-10-CM

## 2014-07-09 DIAGNOSIS — R1012 Left upper quadrant pain: Secondary | ICD-10-CM | POA: Diagnosis not present

## 2014-07-09 DIAGNOSIS — O09529 Supervision of elderly multigravida, unspecified trimester: Secondary | ICD-10-CM | POA: Insufficient documentation

## 2014-07-09 DIAGNOSIS — O09523 Supervision of elderly multigravida, third trimester: Secondary | ICD-10-CM

## 2014-07-09 DIAGNOSIS — O26899 Other specified pregnancy related conditions, unspecified trimester: Secondary | ICD-10-CM

## 2014-07-09 DIAGNOSIS — O368132 Decreased fetal movements, third trimester, fetus 2: Secondary | ICD-10-CM

## 2014-07-09 DIAGNOSIS — Z3A40 40 weeks gestation of pregnancy: Secondary | ICD-10-CM | POA: Insufficient documentation

## 2014-07-09 DIAGNOSIS — D649 Anemia, unspecified: Secondary | ICD-10-CM | POA: Diagnosis present

## 2014-07-09 DIAGNOSIS — O99214 Obesity complicating childbirth: Secondary | ICD-10-CM | POA: Diagnosis present

## 2014-07-09 DIAGNOSIS — Z6841 Body Mass Index (BMI) 40.0 and over, adult: Secondary | ICD-10-CM

## 2014-07-09 DIAGNOSIS — O9902 Anemia complicating childbirth: Secondary | ICD-10-CM | POA: Diagnosis present

## 2014-07-09 DIAGNOSIS — O368190 Decreased fetal movements, unspecified trimester, not applicable or unspecified: Secondary | ICD-10-CM | POA: Insufficient documentation

## 2014-07-09 HISTORY — DX: Gestational (pregnancy-induced) hypertension without significant proteinuria, unspecified trimester: O13.9

## 2014-07-09 NOTE — MAU Note (Signed)
Pt comes in with RUQ sharp pain, starting at 2030. Denies Vaginal bleeding. Has some discharge and leaking. Possible contractions.

## 2014-07-10 ENCOUNTER — Inpatient Hospital Stay (HOSPITAL_COMMUNITY): Payer: BLUE CROSS/BLUE SHIELD

## 2014-07-10 ENCOUNTER — Inpatient Hospital Stay (HOSPITAL_COMMUNITY): Payer: BLUE CROSS/BLUE SHIELD | Admitting: Anesthesiology

## 2014-07-10 ENCOUNTER — Encounter (HOSPITAL_COMMUNITY): Payer: Self-pay | Admitting: Obstetrics and Gynecology

## 2014-07-10 DIAGNOSIS — O368130 Decreased fetal movements, third trimester, not applicable or unspecified: Secondary | ICD-10-CM | POA: Diagnosis present

## 2014-07-10 DIAGNOSIS — O9902 Anemia complicating childbirth: Secondary | ICD-10-CM | POA: Diagnosis present

## 2014-07-10 DIAGNOSIS — O09523 Supervision of elderly multigravida, third trimester: Secondary | ICD-10-CM | POA: Diagnosis not present

## 2014-07-10 DIAGNOSIS — O09529 Supervision of elderly multigravida, unspecified trimester: Secondary | ICD-10-CM | POA: Insufficient documentation

## 2014-07-10 DIAGNOSIS — Z6841 Body Mass Index (BMI) 40.0 and over, adult: Secondary | ICD-10-CM | POA: Diagnosis not present

## 2014-07-10 DIAGNOSIS — O133 Gestational [pregnancy-induced] hypertension without significant proteinuria, third trimester: Secondary | ICD-10-CM | POA: Diagnosis present

## 2014-07-10 DIAGNOSIS — Z3A40 40 weeks gestation of pregnancy: Secondary | ICD-10-CM | POA: Diagnosis present

## 2014-07-10 DIAGNOSIS — R1012 Left upper quadrant pain: Secondary | ICD-10-CM | POA: Diagnosis present

## 2014-07-10 DIAGNOSIS — D649 Anemia, unspecified: Secondary | ICD-10-CM | POA: Diagnosis present

## 2014-07-10 DIAGNOSIS — O368190 Decreased fetal movements, unspecified trimester, not applicable or unspecified: Secondary | ICD-10-CM | POA: Insufficient documentation

## 2014-07-10 DIAGNOSIS — O99214 Obesity complicating childbirth: Secondary | ICD-10-CM | POA: Diagnosis present

## 2014-07-10 LAB — COMPREHENSIVE METABOLIC PANEL
ALK PHOS: 192 U/L — AB (ref 39–117)
ALT: 25 U/L (ref 0–35)
AST: 26 U/L (ref 0–37)
Albumin: 3 g/dL — ABNORMAL LOW (ref 3.5–5.2)
Anion gap: 6 (ref 5–15)
BUN: 9 mg/dL (ref 6–23)
CO2: 23 mmol/L (ref 19–32)
Calcium: 8.8 mg/dL (ref 8.4–10.5)
Chloride: 105 mmol/L (ref 96–112)
Creatinine, Ser: 0.48 mg/dL — ABNORMAL LOW (ref 0.50–1.10)
GFR calc Af Amer: >90 mL/min (ref >90)
Glucose, Bld: 99 mg/dL (ref 70–99)
POTASSIUM: 3.8 mmol/L (ref 3.5–5.1)
SODIUM: 134 mmol/L — AB (ref 135–145)
Total Bilirubin: 0.3 mg/dL (ref 0.3–1.2)
Total Protein: 6.5 g/dL (ref 6.0–8.3)

## 2014-07-10 LAB — AMYLASE: AMYLASE: 50 U/L (ref 0–105)

## 2014-07-10 LAB — PROTEIN / CREATININE RATIO, URINE
CREATININE, URINE: 120 mg/dL
Protein Creatinine Ratio: 0.18 — ABNORMAL HIGH (ref 0.00–0.15)
TOTAL PROTEIN, URINE: 22 mg/dL

## 2014-07-10 LAB — CBC
HEMATOCRIT: 33.4 % — AB (ref 36.0–46.0)
Hemoglobin: 10.9 g/dL — ABNORMAL LOW (ref 12.0–15.0)
MCH: 28.2 pg (ref 26.0–34.0)
MCHC: 32.6 g/dL (ref 30.0–36.0)
MCV: 86.3 fL (ref 78.0–100.0)
Platelets: 156 10*3/uL (ref 150–400)
RBC: 3.87 MIL/uL (ref 3.87–5.11)
RDW: 13.2 % (ref 11.5–15.5)
WBC: 12.9 10*3/uL — ABNORMAL HIGH (ref 4.0–10.5)

## 2014-07-10 LAB — ABO/RH: ABO/RH(D): B POS

## 2014-07-10 LAB — URINALYSIS, ROUTINE W REFLEX MICROSCOPIC
BILIRUBIN URINE: NEGATIVE
Glucose, UA: NEGATIVE mg/dL
HGB URINE DIPSTICK: NEGATIVE
Ketones, ur: NEGATIVE mg/dL
Leukocytes, UA: NEGATIVE
NITRITE: NEGATIVE
PH: 6 (ref 5.0–8.0)
Protein, ur: NEGATIVE mg/dL
SPECIFIC GRAVITY, URINE: 1.015 (ref 1.005–1.030)
UROBILINOGEN UA: 0.2 mg/dL (ref 0.0–1.0)

## 2014-07-10 LAB — TYPE AND SCREEN
ABO/RH(D): B POS
ANTIBODY SCREEN: NEGATIVE

## 2014-07-10 LAB — AMNISURE RUPTURE OF MEMBRANE (ROM) NOT AT ARMC: Amnisure ROM: POSITIVE

## 2014-07-10 LAB — URIC ACID: Uric Acid, Serum: 3.5 mg/dL (ref 2.4–7.0)

## 2014-07-10 LAB — LACTATE DEHYDROGENASE: LDH: 162 U/L (ref 94–250)

## 2014-07-10 LAB — HIV ANTIBODY (ROUTINE TESTING W REFLEX): HIV SCREEN 4TH GENERATION: NONREACTIVE

## 2014-07-10 LAB — LIPASE, BLOOD: Lipase: 23 U/L (ref 11–59)

## 2014-07-10 MED ORDER — PHENYLEPHRINE 40 MCG/ML (10ML) SYRINGE FOR IV PUSH (FOR BLOOD PRESSURE SUPPORT)
80.0000 ug | PREFILLED_SYRINGE | INTRAVENOUS | Status: DC | PRN
  Filled 2014-07-10: qty 2
  Filled 2014-07-10: qty 20

## 2014-07-10 MED ORDER — LACTATED RINGERS IV SOLN
500.0000 mL | Freq: Once | INTRAVENOUS | Status: AC
  Administered 2014-07-10: 500 mL via INTRAVENOUS

## 2014-07-10 MED ORDER — IBUPROFEN 600 MG PO TABS
600.0000 mg | ORAL_TABLET | Freq: Four times a day (QID) | ORAL | Status: DC
  Administered 2014-07-10 – 2014-07-12 (×8): 600 mg via ORAL
  Filled 2014-07-10 (×9): qty 1

## 2014-07-10 MED ORDER — OXYCODONE-ACETAMINOPHEN 5-325 MG PO TABS: 2.0000 | ORAL_TABLET | ORAL | Status: DC | PRN

## 2014-07-10 MED ORDER — SENNOSIDES-DOCUSATE SODIUM 8.6-50 MG PO TABS
2.0000 | ORAL_TABLET | ORAL | Status: DC
  Administered 2014-07-10 – 2014-07-11 (×2): 2 via ORAL
  Filled 2014-07-10 (×2): qty 2

## 2014-07-10 MED ORDER — DIPHENHYDRAMINE HCL 25 MG PO CAPS
25.0000 mg | ORAL_CAPSULE | Freq: Four times a day (QID) | ORAL | Status: DC | PRN
Start: 2014-07-10 — End: 2014-07-12

## 2014-07-10 MED ORDER — DIBUCAINE 1 % RE OINT: 1.0000 "application " | TOPICAL_OINTMENT | RECTAL | Status: DC | PRN

## 2014-07-10 MED ORDER — OXYTOCIN 40 UNITS IN LACTATED RINGERS INFUSION - SIMPLE MED
1.0000 m[IU]/min | INTRAVENOUS | Status: DC
  Administered 2014-07-10: 2 m[IU]/min via INTRAVENOUS
  Filled 2014-07-10: qty 1000

## 2014-07-10 MED ORDER — LIDOCAINE HCL (PF) 1 % IJ SOLN
INTRAMUSCULAR | Status: DC | PRN
  Administered 2014-07-10 (×2): 4 mL

## 2014-07-10 MED ORDER — LANOLIN HYDROUS EX OINT: TOPICAL_OINTMENT | CUTANEOUS | Status: DC | PRN

## 2014-07-10 MED ORDER — BENZOCAINE-MENTHOL 20-0.5 % EX AERO
1.0000 "application " | INHALATION_SPRAY | CUTANEOUS | Status: DC | PRN
  Administered 2014-07-10 – 2014-07-12 (×2): 1 via TOPICAL
  Filled 2014-07-10 (×2): qty 56

## 2014-07-10 MED ORDER — ONDANSETRON HCL 4 MG/2ML IJ SOLN: 4.0000 mg | INTRAMUSCULAR | Status: DC | PRN

## 2014-07-10 MED ORDER — FENTANYL 2.5 MCG/ML BUPIVACAINE 1/10 % EPIDURAL INFUSION (WH - ANES)
14.0000 mL/h | INTRAMUSCULAR | Status: DC | PRN
  Administered 2014-07-10: 14 mL/h via EPIDURAL
  Filled 2014-07-10 (×2): qty 125

## 2014-07-10 MED ORDER — OXYCODONE-ACETAMINOPHEN 5-325 MG PO TABS: 1.0000 | ORAL_TABLET | ORAL | Status: DC | PRN

## 2014-07-10 MED ORDER — TERBUTALINE SULFATE 1 MG/ML IJ SOLN
0.2500 mg | Freq: Once | INTRAMUSCULAR | Status: DC | PRN
  Filled 2014-07-10: qty 1

## 2014-07-10 MED ORDER — OXYTOCIN BOLUS FROM INFUSION: 500.0000 mL | INTRAVENOUS | Status: DC

## 2014-07-10 MED ORDER — TETANUS-DIPHTH-ACELL PERTUSSIS 5-2.5-18.5 LF-MCG/0.5 IM SUSP: 0.5000 mL | Freq: Once | INTRAMUSCULAR | Status: DC

## 2014-07-10 MED ORDER — EPHEDRINE 5 MG/ML INJ
10.0000 mg | INTRAVENOUS | Status: DC | PRN
  Filled 2014-07-10: qty 2

## 2014-07-10 MED ORDER — ONDANSETRON HCL 4 MG PO TABS: 4.0000 mg | ORAL_TABLET | ORAL | Status: DC | PRN

## 2014-07-10 MED ORDER — FLEET ENEMA 7-19 GM/118ML RE ENEM: 1.0000 | ENEMA | RECTAL | Status: DC | PRN

## 2014-07-10 MED ORDER — FENTANYL 2.5 MCG/ML BUPIVACAINE 1/10 % EPIDURAL INFUSION (WH - ANES)
INTRAMUSCULAR | Status: DC | PRN
  Administered 2014-07-10: 14 mL/h via EPIDURAL

## 2014-07-10 MED ORDER — WITCH HAZEL-GLYCERIN EX PADS: 1.0000 "application " | MEDICATED_PAD | CUTANEOUS | Status: DC | PRN

## 2014-07-10 MED ORDER — LACTATED RINGERS IV SOLN
INTRAVENOUS | Status: DC
  Administered 2014-07-10 (×2): via INTRAVENOUS

## 2014-07-10 MED ORDER — ZOLPIDEM TARTRATE 5 MG PO TABS: 5.0000 mg | ORAL_TABLET | Freq: Every evening | ORAL | Status: DC | PRN

## 2014-07-10 MED ORDER — ONDANSETRON HCL 4 MG/2ML IJ SOLN: 4.0000 mg | Freq: Four times a day (QID) | INTRAMUSCULAR | Status: DC | PRN

## 2014-07-10 MED ORDER — SIMETHICONE 80 MG PO CHEW: 80.0000 mg | CHEWABLE_TABLET | ORAL | Status: DC | PRN

## 2014-07-10 MED ORDER — OXYCODONE-ACETAMINOPHEN 5-325 MG PO TABS
1.0000 | ORAL_TABLET | ORAL | Status: DC | PRN
  Administered 2014-07-11 – 2014-07-12 (×3): 1 via ORAL
  Filled 2014-07-10 (×3): qty 1

## 2014-07-10 MED ORDER — ACETAMINOPHEN 325 MG PO TABS
650.0000 mg | ORAL_TABLET | ORAL | Status: DC | PRN
  Administered 2014-07-10: 650 mg via ORAL
  Filled 2014-07-10: qty 2

## 2014-07-10 MED ORDER — BUTORPHANOL TARTRATE 1 MG/ML IJ SOLN: 1.0000 mg | INTRAMUSCULAR | Status: DC | PRN

## 2014-07-10 MED ORDER — DIPHENHYDRAMINE HCL 50 MG/ML IJ SOLN: 12.5000 mg | INTRAMUSCULAR | Status: DC | PRN

## 2014-07-10 MED ORDER — OXYTOCIN 40 UNITS IN LACTATED RINGERS INFUSION - SIMPLE MED
62.5000 mL/h | INTRAVENOUS | Status: DC
  Filled 2014-07-10: qty 1000

## 2014-07-10 MED ORDER — PHENYLEPHRINE 40 MCG/ML (10ML) SYRINGE FOR IV PUSH (FOR BLOOD PRESSURE SUPPORT)
80.0000 ug | PREFILLED_SYRINGE | INTRAVENOUS | Status: DC | PRN
  Administered 2014-07-10 (×2): 80 ug via INTRAVENOUS
  Filled 2014-07-10: qty 2

## 2014-07-10 MED ORDER — LACTATED RINGERS IV SOLN: 500.0000 mL | INTRAVENOUS | Status: DC | PRN

## 2014-07-10 MED ORDER — LIDOCAINE HCL (PF) 1 % IJ SOLN
30.0000 mL | INTRAMUSCULAR | Status: DC | PRN
  Filled 2014-07-10: qty 30

## 2014-07-10 MED ORDER — CITRIC ACID-SODIUM CITRATE 334-500 MG/5ML PO SOLN: 30.0000 mL | ORAL | Status: DC | PRN

## 2014-07-10 MED ORDER — PRENATAL MULTIVITAMIN CH
1.0000 | ORAL_TABLET | Freq: Every day | ORAL | Status: DC
  Administered 2014-07-11 – 2014-07-12 (×2): 1 via ORAL
  Filled 2014-07-10 (×2): qty 1

## 2014-07-10 NOTE — Progress Notes (Signed)
  Subjective: Clomfortable now with epidural.  Objective: BP 94/37 mmHg  Pulse 101  Temp(Src) 98.8 F (37.1 C) (Oral)  Resp 18  Ht 5\' 7"  (1.702 m)  Wt 260 lb (117.935 kg)  BMI 40.71 kg/m2  SpO2 99%      FHT: Category 2--decreased variability with episode of hypotension, now improving with return to BP 109/70 from 94/37.  Received 2 doses phenylephrine UC:   irregular, every 3-6 minutes, mild SVE:   Dilation: 4.5 Effacement (%): 70 Station: -2 Exam by:: Gabrielle Hill, CNM  SROM of forewaters, with copious clear fluid IUPC and FSE applied without difficulty Foley cath inserted easily.  Assessment:  Progressive labor  Plan: Monitor labor status and FHR pattern. Augment prn.  [name], Esteban Kobashigawa CNM 07/10/2014, 3:55 AM

## 2014-07-10 NOTE — H&P (Signed)
History   39 yo G4P1021 at 40 weeks presented c/o LUQ pain just under breasts, very focal to left side.  Started about 8:30pm and has not abated.  Also noted decreased FM since around 7p, occasional UCs, denies bleeding.  Has noted some ? Leaking tonight.  Denies N/V, constipation--had diarrhea yesterday, no BM today.  Denies HA or visual sx.  Feels this sensation is different from indigestion.  Seen in MAU 2/9 for PIH evaluation--BPs 130-140s/80-86, normal PIH labs, PCR 0.19, platelets 158.  Has has severe pedal edema, some improvement since stopping work on 2/9.  Reports cervix was 3 cm on Monday, 2 cm on 07/06/14.  BP 136/78 and 136/74 at those visits, respectively.  History of present pregnancy: Patient entered care at 7 4/7 weeks.   EDC of 07/10/14 was established by LMP and US at 7 4/7 weeks   Anatomy scan:  18 5/7 weeks, with limited spine and cardiac anatomy and an posterior fundal placenta.   Additional US evaluations:  22 2/7 weeks, for anatomy f/u:  Anatomy completed, normal cervical length Significant prenatal events:  Occasional spotting in early pregnancy.  Pedal edema began around 26 weeks, normotensive.  Hgb 9.9 at 29 3/7 weeks, started on Fe.  PIH evaluation on 2/9 due to HA--normal PIH labs and PCR, BPs 130s-140s/80s.  OOW from that point.  Persistent pedal edema, +2/+3 Last evaluation:  07/06/14, cervix 2 cm, 50%, vtx, -2, BP 136/74, weight 262.  Patient Active Problem List   Diagnosis Date Noted  . Abdominal pain in pregnancy 07/09/2014  . Pedal edema 06/30/2014  . Advanced maternal age in multigravida 06/30/2014  . 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 H/O cryosurgery of cervix complicating pregnancy 06/30/2014  . [facility]  . Anxiety 03/21/2011  . Migraine 12/07/2010    Chief Complaint  Patient presents with  . Abdominal Pain   HPI:   As above  OB History     Gravida Para Term Preterm AB TAB SAB Ectopic Multiple Living   4 1   1  1   1     1999--SVB, 40 weeks, 20 hours, 8+4, female, delivered in [place], induced due to gestational hypertension 2014--SAB, 4 weeks 05/2013--SAB, 12 weeks, D&C, [facility]  Past Medical History  Diagnosis Date  . Medical history non-contributory     Past Surgical History  Procedure Laterality Date  . Dilation and evacuation N/A 05/23/2013    Procedure: DILATATION AND EVACUATION;  Surgeon: Zelphia CairoGretchen Adkins, MD;  Location: [facility];  Service: Gynecology;  Laterality: N/A;    Family History  Problem Relation Age of Onset  . Sarcoidosis Mother     s/p lung transplant, post op diabetes  . Arthritis Father   . Dementia Father   . Migraines Neg Hx   . Drug abuse Maternal Aunt   . Drug abuse Maternal Uncle   . Cancer Maternal Grandmother   . Cancer Paternal Grandmother     History  Substance Use Topics  . Smoking status: Never Smoker   . Smokeless tobacco: Not on file  . Alcohol Use: Yes     Comment: Occasionally    Allergies: No Known Allergies  Prescriptions prior to admission  Medication Sig Dispense Refill Last Dose  . acetaminophen (TYLENOL) 325 MG tablet Take 650 mg by mouth every 6 (six) hours as needed for headache.   06/29/2014 at Unknown time  . clotrimazole (GYNE-LOTRIMIN) 1 % vaginal cream Place  1 Applicatorful vaginally at bedtime.   06/29/2014 at Unknown time  . ferrous sulfate 325 (65 FE) MG tablet Take 325 mg by mouth 2 (two) times daily with a meal.   06/30/2014 at Unknown time  . Naphazoline HCl (CLEAR EYES OP) Place 2 drops into the left eye daily as needed (For redness.).   06/30/2014 at Unknown time  . Prenatal Vit-Fe Fumarate-FA (MULTIVITAMIN-PRENATAL) 27-0.8 MG TABS tablet Take 1 tablet by mouth daily at 12 noon.   06/30/2014 at Unknown time    ROS:  Decreased FM, LUQ pain, ? Leaking   Prenatal Transfer Tool  Maternal Diabetes: No Genetic Screening: Normal 1st trimester screen and  AFP Maternal Ultrasounds/Referrals: Normal Fetal Ultrasounds or other Referrals:  None Maternal Substance Abuse:  No Significant Maternal Medications:  None Significant Maternal Lab Results: Lab values include: Group B Strep negative   Physical Exam   Blood pressure 136/78, pulse 87, temperature 98.1 F (36.7 C), temperature source Oral, resp. rate 22.  Filed Vitals:   07/10/14 0001 07/10/14 0002 07/10/14 0015  BP: 136/78 136/78 131/91  Pulse: 87 87 87  Temp:  98.1 F (36.7 C)   TempSrc:  Oral   Resp:  22      Physical Exam  In NAD Chest clear Heart RRR without murmur Abd gravid, mild tenderness to deep palpation in LUQ, no rebound or guarding.  No RUQ pain or tenderness. Uterus soft, NT Pelvic--no pooling, perineum slightly wet, no visible leaking.  Cervix very posterior, loose 2 cm, 75%, vtx, -1. Ext DTR 1+, no clonus, 3+ edema bilaterally  FHR Category 1 in intial 12 min tracing, then decreased variability--? Sleep cycle.   PRENATAL LABS: B+ Antibody negative RPR NR HBsAG negative HIV NR Rubella immune GC/chlamydia negative 11/11/13 Pap WNL 2014. GBS negative 06/08/14 Hgb 11.7 at NOB, 9.9 at 28 weeks Platelets 206 at NOB, 200 at 28 weeks. Urine culture 65K enterococcus at NOB, negative 11/25/13 >100K enteroccocus 12/17/13--treated Negative 9/23 and 04/27/14  ED Course  Assessment: IUP at 40 weeks LUQ pain Decreased FM ? leaking  Plan: Amnisure PIH labs, amylase, lipase PCR BPP/AFI  Nigel Bridgeman CNM, MSN 07/10/2014 12:18 AM   Addendum:  Returned from [place]:  BPP 8/8, vtx, AFI 15.86 Now reports awareness only of UCs, not LUQ pain. UCs irregular, mild/moderate  Amnisure positive.  Filed Vitals:   07/10/14 0015 07/10/14 0030 07/10/14 0123 07/10/14 0134  BP: 131/91 141/96 135/92   Pulse: 87 99 86   Temp: 98.2 F (36.8 C)  98.8 F (37.1 C)   TempSrc: Oral  Oral   Resp: 20  18   Height:     (1.702 m)  Weight:    260 lb (117.935 kg)  SpO2:  100%        CBC    Component Value Date/Time   WBC 12.9* 07/10/2014 0025   RBC 3.87 07/10/2014 0025   HGB 10.9* 07/10/2014 0025   HCT 33.4* 07/10/2014 0025   PLT 156 07/10/2014 0025   MCV 86.3 07/10/2014 0025   MCH 28.2 07/10/2014 0025   MCHC 32.6 07/10/2014 0025   RDW 13.2 07/10/2014 0025   LYMPHSABS 3.2 03/21/2011 1217   MONOABS 0.4 03/21/2011 1217   EOSABS 0.0 03/21/2011 1217   BASOSABS 0.0 03/21/2011 1217   UA negative PCR 0.18  CMP     Component Value Date/Time   NA 134* 07/10/2014 0025   K 3.8 07/10/2014 0025   CL 105 07/10/2014 0025   CO2  23 07/10/2014 0025   GLUCOSE 99 07/10/2014 0025   BUN 9 07/10/2014 0025   CREATININE 0.48* 07/10/2014 0025   CREATININE 0.63 12/23/2010 1635   CALCIUM 8.8 07/10/2014 0025   PROT 6.5 07/10/2014 0025   ALBUMIN 3.0* 07/10/2014 0025   AST 26 07/10/2014 0025   ALT 25 07/10/2014 0025   ALKPHOS 192* 07/10/2014 0025   BILITOT 0.3 07/10/2014 0025   GFRNONAA >90 07/10/2014 0025   GFRAA >90 07/10/2014 0025   Impression: IUP at 40 weeks SROM at 0015, clear fluid Latent phase labor GBS negative Mild gestational hypertension  Plan: Admit to [facility] per consult with Dr. Charlotta Newton. Routine [facility] orders Patient desires MD delivery after 7am. Epidural prn. Augmentation prn if no advancement of labor by Haywood Lasso, CNM 07/10/14 0115

## 2014-07-10 NOTE — Progress Notes (Signed)
  Subjective: Sleeping soundly.  Objective: BP 108/52 mmHg  Pulse 91  Temp(Src) 97.7 F (36.5 C) (Oral)  Resp 18  Ht 5\' 7"  (1.702 m)  Wt 260 lb (117.935 kg)  BMI 40.71 kg/m2  SpO2 99%      FHT: Category 1 UC:   irregular, every 2-4 minutes, mild/moderate SVE:   Dilation: 4 Effacement (%): 70 Station: -2 Exam by:: Manfred ArchV. Jennavie Martinek, CNM--exam from [phone]. Pitocin at 4 mu/min  Assessment:  SROM 0015 Latent labor, on pitocin GBS negative  Plan: Continue pitocin augmentation. Patient desires MD delivery.    [name], Adyn Hoes CNM 07/10/2014, 6:36 AM

## 2014-07-10 NOTE — Progress Notes (Signed)
Gabrielle Hill MRN: [id number]  Subjective: -Patient resting in bed.  Reports that "I feel hot."  Admits to excitement regarding getting close to delivery. States infant name is "Jackelyn HoehnJosiah."  Objective: BP 127/72 mmHg  Pulse 150  Temp(Src) 99.4 F (37.4 C) (Axillary)  Resp 16  Ht 5\' 7"  (1.702 m)  Wt 260 lb (117.935 kg)  BMI 40.71 kg/m2  SpO2 99%   Total I/O In: -  Out: 800 [Urine:800] FHT: 165 bpm, Mod Var, + Variable Decels, +Accels UC:  Q1-762min, palpates moderate, MVUs 210  SVE:   Dilation: 7 Effacement (%): 80 Station: 0 Exam by:: lee Membranes: SROM x 11hrs Pitocin: 4514mUn/min  Assessment:  IUP at 40wks Cat II FT  Afebrile GBS Negative  Plan: -IV fluid bolus given -Position change to resolve Cat II FT -Dr. Lance MorinA. Roberts updated on patient status--No new orders -Continue other mgmt as ordered  Gi Physicians Endoscopy IncEMLY, Miller Limehouse LYNN,MSN, CNM 07/10/2014, 11:09 AM

## 2014-07-10 NOTE — MAU Provider Note (Signed)
History   39 yo G4P1021 at 40 weeks presented c/o LUQ pain just under breasts, very focal to left side.  Started about 8:30pm and has not abated.  Also noted decreased FM since around 7p, occasional UCs, denies bleeding.  Has noted some ? Leaking tonight.  Denies N/V, constipation--had diarrhea yesterday, no BM today.  Denies HA or visual sx.  Feels this sensation is different from indigestion.  Seen in [facility]U 2/9 for PIH evaluation--BPs 130-140s/80-86, normal PIH labs, PCR 0.19, platelets 158.  Has has severe pedal edema, some improvement since stopping work on 2/9.  Reports cervix was 3 cm on Monday, 2 cm on 07/06/14.  BP 136/78 and 136/74 at those visits, respectively.  History of present pregnancy: Patient entered care at 7 4/7 weeks.   EDC of 07/10/14 was established by LMP and US at 7 4/7 weeks   Anatomy scan:  18 5/7 weeks, with limited spine and cardiac anatomy and an posterior fundal placenta.   Additional US evaluations:  22 2/7 weeks, for anatomy f/u:  Anatomy completed, normal cervical length Significant prenatal events:  Occasional spotting in early pregnancy.  Pedal edema began around 26 weeks, normotensive.  Hgb 9.9 at 29 3/7 weeks, started on Fe.  PIH evaluation on 2/9 due to HA--normal PIH labs and PCR, BPs 130s-140s/80s.  OOW from that point.  Persistent pedal edema, +2/+3 Last evaluation:  07/06/14, cervix 2 cm, 50%, vtx, -2, BP 136/74, weight 262.  Patient Active Problem List   Diagnosis Date Noted  . Abdominal pain in pregnancy 07/09/2014  . Pedal edema 06/30/2014  . Advanced maternal age in multigravida 06/30/2014  . 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 H/O cryosurgery of cervix complicating pregnancy 06/30/2014  . [facility]  . Anxiety 03/21/2011  . Migraine 12/07/2010    Chief Complaint  Patient presents with  . Abdominal Pain   HPI:   As above  OB History     Gravida Para Term Preterm AB TAB SAB Ectopic Multiple Living   4 1   1  1   1     1999--SVB, 40 weeks, 20 hours, 8+4, female, delivered in [place], induced due to gestational hypertension 2014--SAB, 4 weeks 05/2013--SAB, 12 weeks, D&C, [facility]  Past Medical History  Diagnosis Date  . Medical history non-contributory     Past Surgical History  Procedure Laterality Date  . Dilation and evacuation N/A 05/23/2013    Procedure: DILATATION AND EVACUATION;  Surgeon: Zelphia CairoGretchen Adkins, MD;  Location: [facility];  Service: Gynecology;  Laterality: N/A;    Family History  Problem Relation Age of Onset  . Sarcoidosis Mother     s/p lung transplant, post op diabetes  . Arthritis Father   . Dementia Father   . Migraines Neg Hx   . Drug abuse Maternal Aunt   . Drug abuse Maternal Uncle   . Cancer Maternal Grandmother   . Cancer Paternal Grandmother     History  Substance Use Topics  . Smoking status: Never Smoker   . Smokeless tobacco: Not on file  . Alcohol Use: Yes     Comment: Occasionally    Allergies: No Known Allergies  Prescriptions prior to admission  Medication Sig Dispense Refill Last Dose  . acetaminophen (TYLENOL) 325 MG tablet Take 650 mg by mouth every 6 (six) hours as needed for headache.   06/29/2014 at Unknown time  . clotrimazole (GYNE-LOTRIMIN) 1 % vaginal cream Place  1 Applicatorful vaginally at bedtime.   06/29/2014 at Unknown time  . ferrous sulfate 325 (65 FE) MG tablet Take 325 mg by mouth 2 (two) times daily with a meal.   06/30/2014 at Unknown time  . Naphazoline HCl (CLEAR EYES OP) Place 2 drops into the left eye daily as needed (For redness.).   06/30/2014 at Unknown time  . Prenatal Vit-Fe Fumarate-FA (MULTIVITAMIN-PRENATAL) 27-0.8 MG TABS tablet Take 1 tablet by mouth daily at 12 noon.   06/30/2014 at Unknown time    ROS:  Decreased FM, LUQ pain, ? Leaking   Prenatal Transfer Tool  Maternal Diabetes: No Genetic Screening: Normal 1st trimester screen and  AFP Maternal Ultrasounds/Referrals: Normal Fetal Ultrasounds or other Referrals:  None Maternal Substance Abuse:  No Significant Maternal Medications:  None Significant Maternal Lab Results: Lab values include: Group B Strep negative   Physical Exam   Blood pressure 136/78, pulse 87, temperature 98.1 F (36.7 C), temperature source Oral, resp. rate 22.  Filed Vitals:   07/10/14 0001 07/10/14 0002 07/10/14 0015  BP: 136/78 136/78 131/91  Pulse: 87 87 87  Temp:  98.1 F (36.7 C)   TempSrc:  Oral   Resp:  22      Physical Exam  In NAD Chest clear Heart RRR without murmur Abd gravid, mild tenderness to deep palpation in LUQ, no rebound or guarding.  No RUQ pain or tenderness. Uterus soft, NT Pelvic--no pooling, perineum slightly wet, no visible leaking.  Cervix very posterior, loose 2 cm, 75%, vtx, -1. Ext DTR 1+, no clonus, 3+ edema bilaterally  FHR Category 1 in intial 12 min tracing, then decreased variability--? Sleep cycle.   PRENATAL LABS: B+ Antibody negative RPR NR HBsAG negative HIV NR Rubella immune GC/chlamydia negative 11/11/13 Pap WNL 2014. GBS negative 06/08/14 Hgb 11.7 at NOB, 9.9 at 28 weeks Platelets 206 at NOB, 200 at 28 weeks. Urine culture 65K enterococcus at NOB, negative 11/25/13 >100K enteroccocus 12/17/13--treated Negative 9/23 and 04/27/14  ED Course  Assessment: IUP at 40 weeks LUQ pain Decreased FM ? leaking  Plan: Amnisure PIH labs, amylase, lipase PCR BPP/AFI  Ray ChurchLATHAM, Shatina Streets CNM, MSN 07/10/2014 12:18 AM

## 2014-07-10 NOTE — Progress Notes (Signed)
  Subjective: Sleeping soundly.  Objective: BP 105/57 mmHg  Pulse 83  Temp(Src) 98 F (36.7 C) (Oral)  Resp 18  Ht 5\' 7"  (1.702 m)  Wt 260 lb (117.935 kg)  BMI 40.71 kg/m2  SpO2 99%      Filed Vitals:   07/10/14 0400 07/10/14 0430 07/10/14 0500 07/10/14 0530  BP: 123/72 103/55 101/58 105/57  Pulse: 86 79 87 83  Temp: 98 F (36.7 C)     TempSrc: Oral     Resp: 18 18 18 18   Height:      Weight:      SpO2:         FHT: Category 1 UC:   regular, every 10 minutes SVE:   Dilation: 4 Effacement (%): 70 Station: -2 Exam by:: Gabrielle Hill, Gabrielle Hill  Asynclitic presentation MVUs 30-45  Assessment:  SROM 0015 Inadequate labor  Plan: Pitocin augmentation Patient agreeable with plan. Position to facilitate rotation/descent.  Gabrielle BridgemanLATHAM, Gabrielle Hill Gabrielle Hill 07/10/2014, 5:37 AM

## 2014-07-10 NOTE — Anesthesia Procedure Notes (Signed)
Epidural Patient location during procedure: OB Start time: 07/10/2014 3:00 AM  Staffing Anesthesiologist: Anistyn Graddy JENNETTE Performed by: anesthesiologist   Preanesthetic Checklist Completed: patient identified, site marked, surgical consent, pre-op evaluation, timeout performed, IV checked, risks and benefits discussed and monitors and equipment checked  Epidural Patient position: sitting Prep: site prepped and draped and DuraPrep Patient monitoring: continuous pulse ox and blood pressure Approach: midline Location: L3-L4 Injection technique: LOR saline  Needle:  Needle type: Tuohy  Needle gauge: 17 G Needle length: 9 cm and 9 Needle insertion depth: 7 cm Catheter type: closed end flexible Catheter size: 19 Gauge Catheter at skin depth: 12 cm Test dose: negative  Assessment Events: blood not aspirated, injection not painful, no injection resistance, negative IV test and no paresthesia  Additional Notes Patient identified. Risks/Benefits/Options discussed with patient including but not limited to bleeding, infection, nerve damage, paralysis, failed block, incomplete pain control, headache, blood pressure changes, nausea, vomiting, reactions to medication both or allergic, itching and postpartum back pain. Confirmed with bedside nurse the patient's most recent platelet count. Confirmed with patient that they are not currently taking any anticoagulation, have any bleeding history or any family history of bleeding disorders. Patient expressed understanding and wished to proceed. All questions were answered. Sterile technique was used throughout the entire procedure. Please see nursing notes for vital signs. Test dose was given through epidural catheter and negative prior to continuing to dose epidural or start infusion. Warning signs of high block given to the patient including shortness of breath, tingling/numbness in hands, complete motor block, or any concerning symptoms with  instructions to call for help. Patient was given instructions on fall risk and not to get out of bed. All questions and concerns addressed with instructions to call with any issues or inadequate analgesia.

## 2014-07-10 NOTE — Progress Notes (Signed)
Gabrielle Hill MRN: [id number]  Subjective: -Care Assumed.  In room to assess.  Patient reports able to rest throughout the night.    Objective: BP 123/76 mmHg  Pulse 101  Temp(Src) 97.7 F (36.5 C) (Oral)  Resp 16  Ht 5\' 7"  (1.702 m)  Wt 260 lb (117.935 kg)  BMI 40.71 kg/m2  SpO2 99%     FHT: 155 bpm, Mod Var, + Variable Decels, +Accels UC:   Q1-622min, palpates mild, MVU 105 SVE: Deferred Membranes: SROM Pitocin: 618mUn/min IUPC and FSE in place  Assessment:  IUP at 40wks Cat II FT  Early Active Labor Pitocin Augmentation  Plan: -Position change to resolve Cat II FT -Discussed POC including continuation of pitocin augmentation -Patient desires MD delivery, informed that Dr. Lance MorinA. Roberts on call -No questions or concerns -Continue other mgmt as ordered  [facility] LPEMLY, Laia Wiley LYNN,MSN, CNM 07/10/2014, 7:40 AM

## 2014-07-10 NOTE — Anesthesia Preprocedure Evaluation (Signed)
Anesthesia Evaluation  Patient identified by MRN, date of birth, ID band Patient awake    Reviewed: Allergy & Precautions, NPO status , Patient's Chart, lab work & pertinent test results  History of Anesthesia Complications Negative for: history of anesthetic complications  Airway Mallampati: II  TM Distance: >3 FB Neck ROM: Full    Dental no notable dental hx. (+) Dental Advisory Given   Pulmonary neg pulmonary ROS,  breath sounds clear to auscultation  Pulmonary exam normal       Cardiovascular hypertension (PIH), negative cardio ROS  Rhythm:Regular Rate:Normal     Neuro/Psych  Headaches, PSYCHIATRIC DISORDERS Anxiety negative psych ROS   GI/Hepatic negative GI ROS, Neg liver ROS,   Endo/Other  Morbid obesity  Renal/GU negative Renal ROS  negative genitourinary   Musculoskeletal negative musculoskeletal ROS (+)   Abdominal   Peds negative pediatric ROS (+)  Hematology negative hematology ROS (+)   Anesthesia Other Findings   Reproductive/Obstetrics (+) Pregnancy                             Anesthesia Physical Anesthesia Plan  ASA: III  Anesthesia Plan: Epidural   Post-op Pain Management:    Induction:   Airway Management Planned:   Additional Equipment:   Intra-op Plan:   Post-operative Plan:   Informed Consent: I have reviewed the patients History and Physical, chart, labs and discussed the procedure including the risks, benefits and alternatives for the proposed anesthesia with the patient or authorized representative who has indicated his/her understanding and acceptance.   Dental advisory given  Plan Discussed with:   Anesthesia Plan Comments:         Anesthesia Quick Evaluation

## 2014-07-11 LAB — CBC
HEMATOCRIT: 30.8 % — AB (ref 36.0–46.0)
Hemoglobin: 10.5 g/dL — ABNORMAL LOW (ref 12.0–15.0)
MCH: 29.3 pg (ref 26.0–34.0)
MCHC: 34.1 g/dL (ref 30.0–36.0)
MCV: 86 fL (ref 78.0–100.0)
Platelets: 146 10*3/uL — ABNORMAL LOW (ref 150–400)
RBC: 3.58 MIL/uL — AB (ref 3.87–5.11)
RDW: 13.2 % (ref 11.5–15.5)
WBC: 17.2 10*3/uL — ABNORMAL HIGH (ref 4.0–10.5)

## 2014-07-11 LAB — RPR: RPR Ser Ql: NONREACTIVE

## 2014-07-11 NOTE — Progress Notes (Signed)
Gabrielle Hill  Post Part[place] Day 1:S/P SVD with 2nd Degree Laceration.  Subjective: Patient up ad lib, denies syncope or dizziness. Reports consuming regular diet without issues and denies N/V. Denies issues with urination and reports bleeding is "better."  Patient is breastfeeding and reports some difficulties.   Pain is being appropriately managed with use of ibuprofen.  Objective: Filed Vitals:   07/10/14 1610 07/10/14 1715 07/10/14 2054 07/11/14 0500  BP: 119/67 122/70 111/44 123/67  Pulse: 111 114 78 82  Temp: 98.6 F (37 C) 98.5 F (36.9 C) 98.1 F (36.7 C) 98.2 F (36.8 C)  TempSrc: Oral Oral Oral Oral  Resp: 20 18 18 18   Height:      Weight:      SpO2:        Recent Labs  07/10/14 0025 07/11/14 0500  HGB 10.9* 10.5*  HCT 33.4* 30.8*    Physical Exam:  General: alert, cooperative and no distress Mood/Affect: Appropriate/Pleasant Lungs: clear to auscultation, no wheezes, rales or rhonchi, symmetric air entry.  Heart: normal rate and regular rhythm. Abdomen:  + bowel sounds, Soft, NT Uterine Fundus: firm at umbilicus Lochia: appropriate Laceration: Healing well Skin: Warm ,dry. DVT Evaluation: No evidence of DVT seen on physical exam. Calf/Ankle edema is present.  Assessment S/P Vaginal Delivery-Day 1 Normal Involution BreastFeeding Issues  Plan: Lactation Consult Infant circumcision today  Continue current care Dr. Lance MorinA. Roberts to be updated on patient status   Gabrielle Hill, Gabrielle FasterJESSICA LYNN, MSN, CNM 07/11/2014, 8:37 AM

## 2014-07-11 NOTE — Anesthesia Postprocedure Evaluation (Signed)
Anesthesia Post Note  Patient: Gabrielle Hill  Procedure(s) Performed: * No procedures listed *  Anesthesia type: Epidural  Patient location: Mother/Baby  Post pain: Pain level controlled  Post assessment: Post-op Vital signs reviewed  Last Vitals:  Filed Vitals:   07/11/14 0500  BP: 123/67  Pulse: 82  Temp: 36.8 C  Resp: 18    Post vital signs: Reviewed  Level of consciousness:alert  Complications: No apparent anesthesia complications

## 2014-07-12 MED ORDER — IBUPROFEN 600 MG PO TABS: 600.0000 mg | ORAL_TABLET | Freq: Four times a day (QID) | ORAL | Status: DC

## 2014-07-12 MED ORDER — OXYCODONE-ACETAMINOPHEN 5-325 MG PO TABS: 1.0000 | ORAL_TABLET | ORAL | Status: DC | PRN

## 2014-07-12 NOTE — Lactation Note (Signed)
This note was copied from the chart of Gabrielle Lucille Passyracey Worth. Lactation Consultation Note' Mom reports baby has been nursing well but nipples are sore. Has comfort gels. Left nipple slightly raw on tip. Suggested changing positions to help nipple to heal. Used cradle hold and baby latched well, nursed for 15 minutes then off to sleep. Mom reports no pain with this latch. Encouraged to place comfort gels in frig when she gets home before placing them on nipples. Few questions about going back to work and introducing bottles answered. No further questions. Reviewed [facility] and OP appointments as resources for support after DC. To call prn  Patient Name: Gabrielle Hill EXBMW'UToday's Date: 07/12/2014 Reason for consult: Follow-up assessment   Maternal Data Formula Feeding for Exclusion: No Has patient been taught Hand Expression?: Yes Does the patient have breastfeeding experience prior to this delivery?: No  Feeding Feeding Type: Breast Fed Length of feed: 15 min  LATCH Score/Interventions Latch: Grasps breast easily, tongue down, lips flanged, rhythmical sucking.  Audible Swallowing: A few with stimulation  Type of Nipple: Everted at rest and after stimulation  Comfort (Breast/Nipple): Filling, red/small blisters or bruises, mild/mod discomfort  Problem noted: Mild/Moderate discomfort Interventions (Mild/moderate discomfort): Comfort gels;Hand expression  Hold (Positioning): Assistance needed to correctly position infant at breast and maintain latch. Intervention(s): Breastfeeding basics reviewed;Support Pillows;Position options;Skin to skin  LATCH Score: 7  Lactation Tools Discussed/Used     Consult Status Consult Status: Complete    Pamelia HoitWeeks, Trynity Skousen D 07/12/2014, 11:33 AM

## 2014-07-12 NOTE — Discharge Summary (Signed)
Vaginal Delivery Discharge Summary  ALL information will be verified prior to discharge  Gabrielle Hill  DOB:    08-10-1975 MRN:    [id number] CSN:    [id number]  Date of admission:                  07/09/14  Date of discharge:                   07/12/14  Procedures this admission: SVD  Date of Delivery: 07/10/14  Newborn Data:  Live born  Information for the patient's newborn:  Gabrielle Hill, Boy Gabrielle Hill [[id number]][[id number]]  female   Live born female  Birth Weight: 8 lb 11 oz (3941 g) APGAR: 7, 9  Home with mother. Name: Gabrielle Hill  Circumcision Plan: IP  History of Present Illness: Gabrielle Hill is a 39 y.o. female, [phone], who presents at 3713w0d weeks gestation. [facility]. She was admitted rupture of membranes. Her pregnancy has been complicated by:  Patient Active Problem List   Diagnosis Date Noted  . Normal labor 07/10/2014  . [redacted] weeks gestation of pregnancy   . Decreased fetal movement   . Maternal age 37+, multigravida, antepartum   . Abdominal pain in pregnancy 07/09/2014  . Pedal edema 06/30/2014  . Advanced maternal age in multigravida 06/30/2014  . 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 H/O cryosurgery of cervix complicating pregnancy 06/30/2014  . [facility]  . Anxiety 03/21/2011  . Migraine 12/07/2010    Hospital course: The patient was admitted for SROM.   Her labor was not complicated. She proceeded to have a vaginal delivery of a healthy infant. Her delivery was complicated with shoulder dystocia. Her postpartum course was not complicated. She was discharged to home on postpartum day 2 doing well.  Feeding: breast  Contraception: oral progesterone-only contraceptive   Discharge hemoglobin: HEMOGLOBIN  Date Value Ref Range Status  07/11/2014 10.5* 12.0 -  15.0 g/dL Final   HCT  Date Value Ref Range Status  07/11/2014 30.8* 36.0 - 46.0 % Final    PreNatal Labs ABO, Rh: --/--/B POS, B POS (02/19 0107)   Antibody: NEG (02/19 0107) Rubella:   immune RPR: Non Reactive (02/19 0146)  HBsAg: Negative (06/23 0000)  HIV: Non-reactive (06/23 0000)  GBS: Negative (01/18 0000)  Discharge Physical Exam:  General: alert and cooperative Lochia: appropriate Uterine Fundus: firm Incision: healing well DVT Evaluation: No evidence of DVT seen on physical exam.  Intrapartum Procedures: spontaneous vaginal delivery with maneuvers to release a shoulder dystocia Postpartum Procedures: none Complications-Operative and Postpartum: 2nd degree perineal laceration  Discharge Diagnoses: Term Pregnancy-delivered, asymptomatic anemia  Activity:           pelvic rest Diet:                routine Medications: PNV, Ibuprofen, Iron and Percocet Condition:      stable     Postpartum Teaching: Nutrition, exercise, return to work or school, family visits, sexual activity, home rest, vaginal bleeding, pelvic rest, family planning, s/s of PPD, breast care peri-care and incision care   Discharge to: home  Follow-up Information    [facility]. Schedule an appointment as soon as possible for a visit in 6 weeks.   Specialty:  Obstetrics and Gynecology   Why:  Postpartum check up, Call with any questions or concerns   Contact information:   [address] [place] [address] [phone]       Adelina Mings, CNM, MSN 07/12/2014. 8:36 AM   Postpartum Care After Vaginal Delivery  After you deliver your newborn (postpartum period), the usual stay in the hospital is 24 72 hours. If there were problems with your labor or delivery, or if you have other medical problems, you might be in the hospital longer.  While you are in the hospital, you will receive help and instructions on how  to care for yourself and your newborn during the postpartum period.  While you are in the hospital:  Be sure to tell your nurses if you have pain or discomfort, as well as where you feel the pain and what makes the pain worse.  If you had an incision made near your vagina (episiotomy) or if you had some tearing during delivery, the nurses may put ice packs on your episiotomy or tear. The ice packs may help to reduce the pain and swelling.  If you are breastfeeding, you may feel uncomfortable contractions of your uterus for a couple of weeks. This is normal. The contractions help your uterus get back to normal size.  It is normal to have some bleeding after delivery.  For the first 1 3 days after delivery, the flow is red and the amount may be similar to a period.  It is common for the flow to start and stop.  In the first few days, you may pass some small clots. Let your nurses know if you begin to pass large clots or your flow increases.  Do not  flush blood clots down the toilet before having the nurse look at them.  During the next 3 10 days after delivery, your flow should become more watery and pink or brown-tinged in color.  Ten to fourteen days after delivery, your flow should be a small amount of yellowish-white discharge.  The amount of your flow will decrease over the first few weeks after delivery. Your flow may stop in 6 8 weeks. Most women have had their flow stop by 12 weeks after delivery.  You should change your sanitary pads frequently.  Wash your hands thoroughly with soap and water for at least 20 seconds after changing pads, using the toilet, or before holding or feeding your newborn.  You should feel like you need to empty your bladder within the first 6 8 hours after delivery.  In case you become weak, lightheaded, or faint, call your nurse before you get out of bed for the first time and before you take a shower for the first time.  Within the first few days  after delivery, your breasts may begin to feel tender and full. This is called engorgement. Breast tenderness usually goes away within 48 72 hours after engorgement occurs. You may also notice milk leaking from your breasts. If you are not breastfeeding, do not stimulate your breasts. Breast stimulation can make your breasts produce more milk.  Spending as much time as possible with your newborn is very important. During this time, you and your newborn can feel close and get to know each other. Having your newborn stay in your room (rooming in) will help to strengthen the bond with your newborn. It will give you time to get to know your newborn and become comfortable caring for your newborn.  Your hormones change after delivery. Sometimes the  hormone changes can temporarily cause you to feel sad or tearful. These feelings should not last more than a few days. If these feelings last longer than that, you should talk to your caregiver.  If desired, talk to your caregiver about methods of family planning or contraception.  Talk to your caregiver about immunizations. Your caregiver may want you to have the following immunizations before leaving the hospital:  Tetanus, diphtheria, and pertussis (Tdap) or tetanus and diphtheria (Td) immunization. It is very important that you and your family (including grandparents) or others caring for your newborn are up-to-date with the Tdap or Td immunizations. The Tdap or Td immunization can help protect your newborn from getting ill.  Rubella immunization.  Varicella (chickenpox) immunization.  Influenza immunization. You should receive this annual immunization if you did not receive the immunization during your pregnancy. Document Released: 03/05/2007 Document Revised: 01/31/2012 Document Reviewed: 01/03/2012 [facility] Patient Information 2014 [place], [place].   Postpartum Depression and Baby Blues  The postpartum period begins right after the birth of a  baby. During this time, there is often a great amount of joy and excitement. It is also a time of considerable changes in the life of the parent(s). Regardless of how many times a mother gives birth, each child brings new challenges and dynamics to the family. It is not unusual to have feelings of excitement accompanied by confusing shifts in moods, emotions, and thoughts. All mothers are at risk of developing postpartum depression or the "baby blues." These mood changes can occur right after giving birth, or they may occur many months after giving birth. The baby blues or postpartum depression can be mild or severe. Additionally, postpartum depression can resolve rather quickly, or it can be a long-term condition. CAUSES Elevated hormones and their rapid decline are thought to be a main cause of postpartum depression and the baby blues. There are a number of hormones that radically change during and after pregnancy. Estrogen and progesterone usually decrease immediately after delivering your baby. The level of thyroid hormone and various cortisol steroids also rapidly drop. Other factors that play a major role in these changes include major life events and genetics.  RISK FACTORS If you have any of the following risks for the baby blues or postpartum depression, know what symptoms to watch out for during the postpartum period. Risk factors that may increase the likelihood of getting the baby blues or postpartum depression include: 1. Havinga personal or family history of depression. 2. Having depression while being pregnant. 3. Having premenstrual or oral contraceptive-associated mood issues. 4. Having exceptional life stress. 5. Having marital conflict. 6. Lacking a social support network. 7. Having a baby with special needs. 8. Having health problems such as diabetes. SYMPTOMS Baby blues symptoms include:  Brief fluctuations in mood, such as going from extreme happiness to sadness.  Decreased  concentration.  Difficulty sleeping.  Crying spells, tearfulness.  Irritability.  Anxiety. Postpartum depression symptoms typically begin within the first month after giving birth. These symptoms include:  Difficulty sleeping or excessive sleepiness.  Marked weight loss.  Agitation.  Feelings of worthlessness.  Lack of interest in activity or food. Postpartum psychosis is a very concerning condition and can be dangerous. Fortunately, it is rare. Displaying any of the following symptoms is cause for immediate medical attention. Postpartum psychosis symptoms include:  Hallucinations and delusions.  Bizarre or disorganized behavior.  Confusion or disorientation. DIAGNOSIS  A diagnosis is made by an evaluation of your symptoms. There are no medical  or lab tests that lead to a diagnosis, but there are various questionnaires that a caregiver may use to identify those with the baby blues, postpartum depression, or psychosis. Often times, a screening tool called the [place] Postnatal Depression Scale is used to diagnose depression in the postpartum period.  TREATMENT The baby blues usually goes away on its own in 1 to 2 weeks. Social support is often all that is needed. You should be encouraged to get adequate sleep and rest. Occasionally, you may be given medicines to help you sleep.  Postpartum depression requires treatment as it can last several months or longer if it is not treated. Treatment may include individual or group therapy, medicine, or both to address any social, physiological, and psychological factors that may play a role in the depression. Regular exercise, a healthy diet, rest, and social support may also be strongly recommended.  Postpartum psychosis is more serious and needs treatment right away. Hospitalization is often needed. HOME CARE INSTRUCTIONS  Get as much rest as you can. Nap when the baby sleeps.  Exercise regularly. Some women find yoga and walking to be  beneficial.  Eat a balanced and nourishing diet.  Do little things that you enjoy. Have a cup of tea, take a bubble bath, read your favorite magazine, or listen to your favorite music.  Avoid alcohol.  Ask for help with household chores, cooking, grocery shopping, or running errands as needed. Do not try to do everything.  Talk to people close to you about how you are feeling. Get support from your partner, family members, friends, or other new moms.  Try to stay positive in how you think. Think about the things you are grateful for.  Do not spend a lot of time alone.  Only take medicine as directed by your caregiver.  Keep all your postpartum appointments.  Let your caregiver know if you have any concerns. SEEK MEDICAL CARE IF: You are having a reaction or problems with your medicine. SEEK IMMEDIATE MEDICAL CARE IF:  You have suicidal feelings.  You feel you may harm the baby or someone else. Document Released: 02/10/2004 Document Revised: 07/31/2011 Document Reviewed: 03/14/2011 [facility] Patient Information 2014 [place], [place].     Breastfeeding Deciding to breastfeed is one of the best choices you can make for you and your baby. A change in hormones during pregnancy causes your breast tissue to grow and increases the number and size of your milk ducts. These hormones also allow proteins, sugars, and fats from your blood supply to make breast milk in your milk-producing glands. Hormones prevent breast milk from being released before your baby is born as well as prompt milk flow after birth. Once breastfeeding has begun, thoughts of your baby, as well as his or her sucking or crying, can stimulate the release of milk from your milk-producing glands.  BENEFITS OF BREASTFEEDING For Your Baby  Your first milk (colostrum) helps your baby's digestive system function better.   There are antibodies in your milk that help your baby fight off infections.   Your baby has a lower  incidence of asthma, allergies, and sudden infant death syndrome.   The nutrients in breast milk are better for your baby than infant formulas and are designed uniquely for your baby's needs.   Breast milk improves your baby's brain development.   Your baby is less likely to develop other conditions, such as childhood obesity, asthma, or type 2 diabetes mellitus.  For You   Breastfeeding helps to  create a very special bond between you and your baby.   Breastfeeding is convenient. Breast milk is always available at the correct temperature and costs nothing.   Breastfeeding helps to burn calories and helps you lose the weight gained during pregnancy.   Breastfeeding makes your uterus contract to its prepregnancy size faster and slows bleeding (lochia) after you give birth.   Breastfeeding helps to lower your risk of developing type 2 diabetes mellitus, osteoporosis, and breast or ovarian cancer later in life. SIGNS THAT YOUR BABY IS HUNGRY Early Signs of Hunger  Increased alertness or activity.  Stretching.  Movement of the head from side to side.  Movement of the head and opening of the mouth when the corner of the mouth or cheek is stroked (rooting).  Increased sucking sounds, smacking lips, cooing, sighing, or squeaking.  Hand-to-mouth movements.  Increased sucking of fingers or hands. Late Signs of Hunger  Fussing.  Intermittent crying. Extreme Signs of Hunger Signs of extreme hunger will require calming and consoling before your baby will be able to breastfeed successfully. Do not wait for the following signs of extreme hunger to occur before you initiate breastfeeding:   Restlessness.  A loud, strong cry.   Screaming.   BREASTFEEDING BASICS Breastfeeding Initiation  Find a comfortable place to sit or lie down, with your neck and back well supported.  Place a pillow or rolled up blanket under your baby to bring him or her to the level of your breast  (if you are seated). Nursing pillows are specially designed to help support your arms and your baby while you breastfeed.  Make sure that your baby's abdomen is facing your abdomen.   Gently massage your breast. With your fingertips, massage from your chest wall toward your nipple in a circular motion. This encourages milk flow. You may need to continue this action during the feeding if your milk flows slowly.  Support your breast with 4 fingers underneath and your thumb above your nipple. Make sure your fingers are well away from your nipple and your baby's mouth.   Stroke your baby's lips gently with your finger or nipple.   When your baby's mouth is open wide enough, quickly bring your baby to your breast, placing your entire nipple and as much of the colored area around your nipple (areola) as possible into your baby's mouth.   More areola should be visible above your baby's upper lip than below the lower lip.   Your baby's tongue should be between his or her lower gum and your breast.   Ensure that your baby's mouth is correctly positioned around your nipple (latched). Your baby's lips should create a seal on your breast and be turned out (everted).  It is common for your baby to suck about 2-3 minutes in order to start the flow of breast milk. Latching Teaching your baby how to latch on to your breast properly is very important. An improper latch can cause nipple pain and decreased milk supply for you and poor weight gain in your baby. Also, if your baby is not latched onto your nipple properly, he or she may swallow some air during feeding. This can make your baby fussy. Burping your baby when you switch breasts during the feeding can help to get rid of the air. However, teaching your baby to latch on properly is still the best way to prevent fussiness from swallowing air while breastfeeding. Signs that your baby has successfully latched on to your nipple:  Silent tugging or  silent sucking, without causing you pain.   Swallowing heard between every 3-4 sucks.    Muscle movement above and in front of his or her ears while sucking.  Signs that your baby has not successfully latched on to nipple:   Sucking sounds or smacking sounds from your baby while breastfeeding.  Nipple pain. If you think your baby has not latched on correctly, slip your finger into the corner of your baby's mouth to break the suction and place it between your baby's gums. Attempt breastfeeding initiation again. Signs of Successful Breastfeeding Signs from your baby:   A gradual decrease in the number of sucks or complete cessation of sucking.   Falling asleep.   Relaxation of his or her body.   Retention of a small amount of milk in his or her mouth.   Letting go of your breast by himself or herself. Signs from you:  Breasts that have increased in firmness, weight, and size 1-3 hours after feeding.   Breasts that are softer immediately after breastfeeding.  Increased milk volume, as well as a change in milk consistency and color by the fifth day of breastfeeding.   Nipples that are not sore, cracked, or bleeding. Signs That Your Pecola Leisure is Getting Enough Milk  Wetting at least 3 diapers in a 24-hour period. The urine should be clear and pale yellow by age 29 days.  At least 3 stools in a 24-hour period by age 29 days. The stool should be soft and yellow.  At least 3 stools in a 24-hour period by age 31 days. The stool should be seedy and yellow.  No loss of weight greater than 10% of birth weight during the first 87 days of age.  Average weight gain of 4-7 ounces (113-198 g) per week after age 48 days.  Consistent daily weight gain by age 29 days, without weight loss after the age of 2 weeks. After a feeding, your baby may spit up a small amount. This is common. BREASTFEEDING FREQUENCY AND DURATION Frequent feeding will help you make more milk and can prevent sore  nipples and breast engorgement. Breastfeed when you feel the need to reduce the fullness of your breasts or when your baby shows signs of hunger. This is called "breastfeeding on demand." Avoid introducing a pacifier to your baby while you are working to establish breastfeeding (the first 4-6 weeks after your baby is born). After this time you may choose to use a pacifier. Research has shown that pacifier use during the first year of a baby's life decreases the risk of sudden infant death syndrome (SIDS). Allow your baby to feed on each breast as long as he or she wants. Breastfeed until your baby is finished feeding. When your baby unlatches or falls asleep while feeding from the first breast, offer the second breast. Because newborns are often sleepy in the first few weeks of life, you may need to awaken your baby to get him or her to feed. Breastfeeding times will vary from baby to baby. However, the following rules can serve as a guide to help you ensure that your baby is properly fed:  Newborns (babies 75 weeks of age or younger) may breastfeed every 1-3 hours.  Newborns should not go longer than 3 hours during the day or 5 hours during the night without breastfeeding.  You should breastfeed your baby a minimum of 8 times in a 24-hour period until you begin to introduce solid foods to  your baby at around 22 months of age. BREAST MILK PUMPING Pumping and storing breast milk allows you to ensure that your baby is exclusively fed your breast milk, even at times when you are unable to breastfeed. This is especially important if you are going back to work while you are still breastfeeding or when you are not able to be present during feedings. Your lactation consultant can give you guidelines on how long it is safe to store breast milk.  A breast pump is a machine that allows you to pump milk from your breast into a sterile bottle. The pumped breast milk can then be stored in a refrigerator or freezer. Some  breast pumps are operated by hand, while others use electricity. Ask your lactation consultant which type will work best for you. Breast pumps can be purchased, but some hospitals and breastfeeding support groups lease breast pumps on a monthly basis. A lactation consultant can teach you how to hand express breast milk, if you prefer not to use a pump.  CARING FOR YOUR BREASTS WHILE YOU BREASTFEED Nipples can become dry, cracked, and sore while breastfeeding. The following recommendations can help keep your breasts moisturized and healthy:  Avoid using soap on your nipples.   Wear a supportive bra. Although not required, special nursing bras and tank tops are designed to allow access to your breasts for breastfeeding without taking off your entire bra or top. Avoid wearing underwire-style bras or extremely tight bras.  Air dry your nipples for 3-24minutes after each feeding.   Use only cotton bra pads to absorb leaked breast milk. Leaking of breast milk between feedings is normal.   Use lanolin on your nipples after breastfeeding. Lanolin helps to maintain your skin's normal moisture barrier. If you use pure lanolin, you do not need to wash it off before feeding your baby again. Pure lanolin is not toxic to your baby. You may also hand express a few drops of breast milk and gently massage that milk into your nipples and allow the milk to air dry. In the first few weeks after giving birth, some women experience extremely full breasts (engorgement). Engorgement can make your breasts feel heavy, warm, and tender to the touch. Engorgement peaks within 3-5 days after you give birth. The following recommendations can help ease engorgement:  Completely empty your breasts while breastfeeding or pumping. You may want to start by applying warm, moist heat (in the shower or with warm water-soaked hand towels) just before feeding or pumping. This increases circulation and helps the milk flow. If your baby does  not completely empty your breasts while breastfeeding, pump any extra milk after he or she is finished.  Wear a snug bra (nursing or regular) or tank top for 1-2 days to signal your body to slightly decrease milk production.  Apply ice packs to your breasts, unless this is too uncomfortable for you.  Make sure that your baby is latched on and positioned properly while breastfeeding. If engorgement persists after 48 hours of following these recommendations, contact your health care provider or a Advertising copywriter. OVERALL HEALTH CARE RECOMMENDATIONS WHILE BREASTFEEDING  Eat healthy foods. Alternate between meals and snacks, eating 3 of each per day. Because what you eat affects your breast milk, some of the foods may make your baby more irritable than usual. Avoid eating these foods if you are sure that they are negatively affecting your baby.  Drink milk, fruit juice, and water to satisfy your thirst (about 10 glasses  a day).   Rest often, relax, and continue to take your prenatal vitamins to prevent fatigue, stress, and anemia.  Continue breast self-awareness checks.  Avoid chewing and smoking tobacco.  Avoid alcohol and drug use. Some medicines that may be harmful to your baby can pass through breast milk. It is important to ask your health care provider before taking any medicine, including all over-the-counter and prescription medicine as well as vitamin and herbal supplements. It is possible to become pregnant while breastfeeding. If birth control is desired, ask your health care provider about options that will be safe for your baby. SEEK MEDICAL CARE IF:   You feel like you want to stop breastfeeding or have become frustrated with breastfeeding.  You have painful breasts or nipples.  Your nipples are cracked or bleeding.  Your breasts are red, tender, or warm.  You have a swollen area on either breast.  You have a fever or chills.  You have nausea or vomiting.  You  have drainage other than breast milk from your nipples.  Your breasts do not become full before feedings by the fifth day after you give birth.  You feel sad and depressed.  Your baby is too sleepy to eat well.  Your baby is having trouble sleeping.   Your baby is wetting less than 3 diapers in a 24-hour period.  Your baby has less than 3 stools in a 24-hour period.  Your baby's skin or the white part of his or her eyes becomes yellow.   Your baby is not gaining weight by 405 days of age. SEEK IMMEDIATE MEDICAL CARE IF:   Your baby is overly tired (lethargic) and does not want to wake up and feed.  Your baby develops an unexplained fever. Document Released: 05/08/2005 Document Revised: 05/13/2013 Document Reviewed: 10/30/2012 [facility] Patient Information 2015 [employer] [place]. This information is not intended to replace advice given to you by your health care provider. Make sure you discuss any questions you have with your health care provider.

## 2014-09-09 LAB — HM PAP SMEAR

## 2015-05-23 NOTE — L&D Delivery Note (Signed)
Delivery Note At 12:26 AM, on Dec. 9, 2017, a viable female "Doreene AdasSimone Grace" was delivered via Vaginal, Spontaneous Delivery (Presentation:Left Occiput Anterior with restitution to LOT ).  After delivery of head, nuchal cord noted that shoulders and body was delivered through via somersault maneuver. Infant with good tone, but minimal grimace as provider gave tactile stimulation and placed on mother's abdomen.  Nurses continued tactile stimulation and infant  APGAR: 7, 9. Cord clamped, cut, and blood collected. Placenta delivered spontaneously and noted to be intact with 3VC upon inspection.  Vaginal inspection revealed a hemostatic1st degree perineal laceration that was not repaired. Fundus firm, at the umbilicus, and bleeding small.  Mother hemodynamically stable and infant skin to skin prior to provider exit. Family plans for vasectomy for birth control method and mother opts to breastfeed.  Infant weight at one hour of life: 7lbs 13.8oz, 20in  Anesthesia:  Epidural Episiotomy: None Lacerations: 1st degree-Unrepaired Suture Repair: None Est. Blood Loss (mL): 200  Mom to postpartum.  Baby to Couplet care / Skin to Skin.  Cherre RobinsJessica L Warwick Nick MSN, CNM 04/29/2016, 12:53 AM

## 2015-06-11 ENCOUNTER — Encounter: Payer: Self-pay | Admitting: Family Medicine

## 2015-06-11 ENCOUNTER — Ambulatory Visit (INDEPENDENT_AMBULATORY_CARE_PROVIDER_SITE_OTHER): Payer: BLUE CROSS/BLUE SHIELD | Admitting: Family Medicine

## 2015-06-11 VITALS — BP 132/86 | HR 68 | Temp 98.5°F | Ht 67.0 in | Wt 205.8 lb

## 2015-06-11 DIAGNOSIS — J069 Acute upper respiratory infection, unspecified: Secondary | ICD-10-CM | POA: Diagnosis not present

## 2015-06-11 DIAGNOSIS — B9789 Other viral agents as the cause of diseases classified elsewhere: Principal | ICD-10-CM

## 2015-06-11 NOTE — Patient Instructions (Signed)
You have a viral upper respiratory infection   Drink lots of fluids and rest as much as you can  Try aleve 2 pills with food every 12 hours You can still take tylenol Try nasal saline spray for nasal congestion flonase nasal spray over the counter can help congestion and ear fullness- use it for a few weeks  If you have a very runny nose - try zyrtec or other antihistamine Breathe steam   Update if not starting to improve in a week or if worsening

## 2015-06-11 NOTE — Progress Notes (Signed)
Subjective:    Patient ID: Gabrielle Hill, female    DOB: 01-12-1976, 40 y.o.   MRN: [id number]  HPI  Here with uri symptoms  Has and ear ache on the R -that is new for her   Had a fever this am - tylenol helps (101)  Cough - is occ productive of clear mucous  Nasal stuffiness and running  Facial pressure -feels heavy  Is tired   ST - is burns/irritated/not severe   No other otc meds   Not pregnant or breast feeding   Patient Active Problem List   Diagnosis Date Noted  . Normal labor 07/10/2014  . [redacted] weeks gestation of pregnancy   . Decreased fetal movement   . Maternal age 88+, multigravida, antepartum   . Abdominal pain in pregnancy 07/09/2014  . Pedal edema 06/30/2014  . Advanced maternal age in multigravida 06/30/2014  . 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 H/O cryosurgery of cervix complicating pregnancy 06/30/2014  . Prior pregnancy complicated by [facility], antepartum 06/30/2014  . Anxiety 03/21/2011  . Migraine 12/07/2010   Past Medical History  Diagnosis Date  . Pregnancy induced hypertension     in first pregnancy 16 years ago   Past Surgical History  Procedure Laterality Date  . Dilation and evacuation N/A 05/23/2013    Procedure: DILATATION AND EVACUATION;  Surgeon: Zelphia Cairo, MD;  Location: [facility];  Service: Gynecology;  Laterality: N/A;   Social History  Substance Use Topics  . Smoking status: Never Smoker   . Smokeless tobacco: None  . Alcohol Use: No     Comment: Occasionally   Family History  Problem Relation Age of Onset  . Sarcoidosis Mother     s/p lung transplant, post op diabetes  . Arthritis Father   . Dementia Father   . Migraines Neg Hx   . Drug abuse Maternal Aunt   . Drug abuse Maternal Uncle   . Cancer Maternal Grandmother   . Cancer Paternal Grandmother    No Known Allergies No current outpatient prescriptions on file prior to visit.   No current  facility-administered medications on file prior to visit.       Review of Systems  Constitutional: Positive for fever, appetite change and fatigue.  HENT: Positive for congestion, ear pain, postnasal drip, rhinorrhea, sinus pressure, sneezing and sore throat.   Eyes: Negative for pain and discharge.  Respiratory: Positive for cough. Negative for shortness of breath, wheezing and stridor.   Cardiovascular: Negative for chest pain.  Gastrointestinal: Negative for nausea, vomiting and diarrhea.  Genitourinary: Negative for urgency, frequency and hematuria.  Musculoskeletal: Negative for myalgias and arthralgias.  Skin: Negative for rash.  Neurological: Positive for headaches. Negative for dizziness, weakness and light-headedness.  Psychiatric/Behavioral: Negative for confusion and dysphoric mood.       Objective:   Physical Exam  Constitutional: She appears well-developed and well-nourished. No distress.  overwt and well app  HENT:  Head: Normocephalic and atraumatic.  Right Ear: External ear normal.  Left Ear: External ear normal.  Mouth/Throat: Oropharynx is clear and moist.  Nares are injected and congested  No sinus tenderness TMs are dull-no erythema  Clear rhinorrhea and post nasal drip   Eyes: Conjunctivae and EOM are normal. Pupils are equal, round, and reactive to light. Right eye exhibits no discharge. Left eye exhibits no discharge.  Neck: Normal range of motion. Neck supple.  Cardiovascular: Normal rate and normal heart sounds.  Pulmonary/Chest: Effort normal and breath sounds normal. No respiratory distress. She has no wheezes. She has no rales. She exhibits no tenderness.  Lymphadenopathy:    She has no cervical adenopathy.  Neurological: She is alert.  Skin: Skin is warm and dry. No rash noted.  Psychiatric: She has a normal mood and affect.          Assessment & Plan:   Problem List Items Addressed This Visit      Respiratory   Viral URI with cough -  Primary    Reassuring exam  Disc symptomatic care - see instructions on AVS  You have a viral upper respiratory infection   Drink lots of fluids and rest as much as you can  Try aleve 2 pills with food every 12 hours You can still take tylenol Try nasal saline spray for nasal congestion flonase nasal spray over the counter can help congestion and ear fullness- use it for a few weeks  If you have a very runny nose - try zyrtec or other antihistamine Breathe steam   Update if not starting to improve in a week or if worsening

## 2015-06-11 NOTE — Progress Notes (Signed)
Pre visit review using our clinic review tool, if applicable. No additional management support is needed unless otherwise documented below in the visit note. 

## 2015-06-13 NOTE — Assessment & Plan Note (Signed)
Reassuring exam  Disc symptomatic care - see instructions on AVS  You have a viral upper respiratory infection   Drink lots of fluids and rest as much as you can  Try aleve 2 pills with food every 12 hours You can still take tylenol Try nasal saline spray for nasal congestion flonase nasal spray over the counter can help congestion and ear fullness- use it for a few weeks  If you have a very runny nose - try zyrtec or other antihistamine Breathe steam   Update if not starting to improve in a week or if worsening
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
## 2015-09-01 ENCOUNTER — Encounter: Payer: Self-pay | Admitting: Family Medicine

## 2015-09-01 ENCOUNTER — Ambulatory Visit (INDEPENDENT_AMBULATORY_CARE_PROVIDER_SITE_OTHER): Payer: BLUE CROSS/BLUE SHIELD | Admitting: Family Medicine

## 2015-09-01 VITALS — BP 122/72 | HR 72 | Temp 97.7°F | Ht 67.0 in | Wt 195.0 lb

## 2015-09-01 DIAGNOSIS — Z3201 Encounter for pregnancy test, result positive: Secondary | ICD-10-CM

## 2015-09-01 DIAGNOSIS — N912 Amenorrhea, unspecified: Secondary | ICD-10-CM | POA: Diagnosis not present

## 2015-09-01 LAB — POCT URINE PREGNANCY: Preg Test, Ur: POSITIVE — AB

## 2015-09-01 NOTE — Assessment & Plan Note (Signed)
Positive U preg. [redacted] weeks pregnant based on LMP. Advised start PNV. She already has appt scheduled with OB.

## 2015-09-01 NOTE — Patient Instructions (Signed)
Great to see you. Congratulations!  You are [redacted] weeks pregnant.  Your due date is 04/14/16  Please start taking a prenatal vitamin and keep your appointment with your OB.

## 2015-09-01 NOTE — Progress Notes (Signed)
   Subjective:   Patient ID: Gabrielle Hill, female    DOB: [birth year], 40 y.o.   MRN: [id number]  Gabrielle Hill is a pleasant 40 y.o. year old female who presents to clinic today with Amenorrhea  on 09/01/2015  HPI:  Here to confirm pregnancy.  LMP 07/09/15.  Has been having breast tenderness and fatigue.  No nausea or vomiting. No vaginal bleeding.  Current Outpatient Prescriptions on File Prior to Visit  Medication Sig Dispense Refill  . PRESCRIPTION MEDICATION Take 1 tablet by mouth daily. BIRTH CONTROL PILL     No current facility-administered medications on file prior to visit.    No Known Allergies  Past Medical History  Diagnosis Date  . Pregnancy induced hypertension     in first pregnancy 16 years ago    Past Surgical History  Procedure Laterality Date  . Dilation and evacuation N/A 05/23/2013    Procedure: DILATATION AND EVACUATION;  Surgeon: Zelphia CairoGretchen Adkins, MD;  Location: [facility];  Service: Gynecology;  Laterality: N/A;    Family History  Problem Relation Age of Onset  . Sarcoidosis Mother     s/p lung transplant, post op diabetes  . Arthritis Father   . Dementia Father   . Migraines Neg Hx   . Drug abuse Maternal Aunt   . Drug abuse Maternal Uncle   . Cancer Maternal Grandmother   . Cancer Paternal Grandmother     Social History   Social History  . Marital Status: Married    Spouse Name: N/A  . Number of Children: 1  . Years of Education: N/A   Occupational History  . journalist    Social History Main Topics  . Smoking status: Never Smoker   . Smokeless tobacco: Not on file  . Alcohol Use: 0.0 oz/week    0 Standard drinks or equivalent per week     Comment: Occasionally  . Drug Use: No  . Sexual Activity: Yes   Other Topics Concern  . Not on file   Social History Narrative   G1P1, daughter.  Uncomplicated pregnancy and delivery.   Regular exercise.     [place] in [place]   No surgeries.   The PMH, PSH, Social History, Family  History, Medications, and allergies have been reviewed in [facility], and have been updated if relevant.   Review of Systems  Constitutional: Positive for fatigue.  Gastrointestinal: Negative.   Genitourinary: Negative.   All other systems reviewed and are negative.      Objective:    BP 122/72 mmHg  Pulse 72  Temp(Src) 97.7 F (36.5 C) (Oral)  Ht 5\' 7"  (1.702 m)  Wt 195 lb (88.451 kg)  BMI 30.53 kg/m2  SpO2 99%  LMP 07/09/2015   Physical Exam  Constitutional: She is oriented to person, place, and time. She appears well-developed and well-nourished. No distress.  HENT:  Head: Normocephalic.  Eyes: Conjunctivae are normal.  Cardiovascular: Normal rate.   Pulmonary/Chest: Effort normal.  Musculoskeletal: Normal range of motion.  Neurological: She is alert and oriented to person, place, and time. No cranial nerve deficit.  Skin: Skin is warm and dry. She is not diaphoretic.  Psychiatric: She has a normal mood and affect. Her behavior is normal. Judgment and thought content normal.          Assessment & Plan:   Amenorrhea - Plan: POCT urine pregnancy No Follow-up on file.

## 2015-09-01 NOTE — Progress Notes (Signed)
Pre visit review using our clinic review tool, if applicable. No additional management support is needed unless otherwise documented below in the visit note. 

## 2015-09-25 ENCOUNTER — Encounter (HOSPITAL_COMMUNITY): Payer: Self-pay | Admitting: *Deleted

## 2015-09-25 ENCOUNTER — Inpatient Hospital Stay (HOSPITAL_COMMUNITY): Payer: BLUE CROSS/BLUE SHIELD

## 2015-09-25 ENCOUNTER — Inpatient Hospital Stay (HOSPITAL_COMMUNITY)
Admission: AD | Admit: 2015-09-25 | Discharge: 2015-09-25 | Disposition: A | Discharge status: Home / Self Care | Payer: BLUE CROSS/BLUE SHIELD | Source: Ambulatory Visit | Attending: Obstetrics & Gynecology | Admitting: Obstetrics & Gynecology

## 2015-09-25 DIAGNOSIS — Z3A08 8 weeks gestation of pregnancy: Secondary | ICD-10-CM | POA: Diagnosis not present

## 2015-09-25 DIAGNOSIS — O2620 Pregnancy care for patient with recurrent pregnancy loss, unspecified trimester: Secondary | ICD-10-CM | POA: Diagnosis present

## 2015-09-25 DIAGNOSIS — O209 Hemorrhage in early pregnancy, unspecified: Secondary | ICD-10-CM

## 2015-09-25 DIAGNOSIS — F419 Anxiety disorder, unspecified: Secondary | ICD-10-CM | POA: Diagnosis not present

## 2015-09-25 DIAGNOSIS — N939 Abnormal uterine and vaginal bleeding, unspecified: Secondary | ICD-10-CM

## 2015-09-25 LAB — WET PREP, GENITAL
CLUE CELLS WET PREP: NONE SEEN
SPERM: NONE SEEN
Trich, Wet Prep: NONE SEEN
Yeast Wet Prep HPF POC: NONE SEEN

## 2015-09-25 LAB — CBC
HCT: 31.5 % — ABNORMAL LOW (ref 36.0–46.0)
HEMOGLOBIN: 10.6 g/dL — AB (ref 12.0–15.0)
MCH: 27.3 pg (ref 26.0–34.0)
MCHC: 33.7 g/dL (ref 30.0–36.0)
MCV: 81.2 fL (ref 78.0–100.0)
Platelets: 169 10*3/uL (ref 150–400)
RBC: 3.88 MIL/uL (ref 3.87–5.11)
RDW: 12.9 % (ref 11.5–15.5)
WBC: 7.2 10*3/uL (ref 4.0–10.5)

## 2015-09-25 LAB — POCT FERN TEST: POCT Fern Test: NEGATIVE

## 2015-09-25 LAB — HCG, QUANTITATIVE, PREGNANCY: hCG, Beta Chain, Quant, S: 73630 m[IU]/mL — ABNORMAL HIGH (ref <5)

## 2015-09-25 NOTE — MAU Provider Note (Signed)
Gabrielle Hill is a 40 yo, G5P2021 at 53 ega by early [place] on 09/15/15 presenting to MAU announced for first trimester vaginal bleeding.   She reports waking up at 1230a and using the restroom where she noted a "clot that looked like tissue" and vaginal bleeding when she wiped. Subsequent visits to the bathroom she did not notice bleeding but had mild cramping. Denies recent Intercourse, last intercourse over a month ago.  Her history is significant for two spontaneous abortions; 4wks in 2014 and 11 weeks in 2015.   IUP was confirmed last Friday with another viability [place] scheduled for 10/01/15.    History     Patient Active Problem List   Diagnosis Date Noted  . First trimester bleeding 09/25/2015  . Positive pregnancy test 09/01/2015  . Maternal age 44+, multigravida, antepartum   . Advanced maternal age in multigravida 06/30/2014  . 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 H/O cryosurgery of cervix complicating pregnancy 06/30/2014  . Prior pregnancy complicated by [facility], antepartum 06/30/2014  . Anxiety 03/21/2011  . Migraine 12/07/2010    Chief Complaint  Patient presents with  . Vaginal Bleeding   HPI  OB History    Gravida Para Term Preterm AB TAB SAB Ectopic Multiple Living   0 1      Past Medical History  Diagnosis Date  . Pregnancy induced hypertension     in first pregnancy 16 years ago    Past Surgical History  Procedure Laterality Date  . Dilation and evacuation N/A 05/23/2013    Procedure: DILATATION AND EVACUATION;  Surgeon: Zelphia Cairo, MD;  Location: [facility];  Service: Gynecology;  Laterality: N/A;    Family History  Problem Relation Age of Onset  . Sarcoidosis Mother     s/p lung transplant, post op diabetes  . Arthritis Father   . Dementia Father   . Migraines Neg Hx   . Drug abuse Maternal Aunt   . Drug abuse Maternal Uncle   . Cancer Maternal Grandmother   . Cancer Paternal Grandmother      Social History  Substance Use Topics  . Smoking status: Never Smoker   . Smokeless tobacco: None  . Alcohol Use: 0.0 oz/week    0 Standard drinks or equivalent per week     Comment: Occasionally    Allergies: No Known Allergies  No prescriptions prior to admission    ROS Physical Exam   Blood pressure 121/69, pulse 75, temperature 97.8 F (36.6 C), resp. rate 18, height 5' 6.5" (1.689 m), weight 90.719 kg (200 lb), last menstrual period 07/09/2015, unknown if currently breastfeeding.   Results for orders placed or performed during the hospital encounter of 09/25/15 (from the past 24 hour(s))  hCG, quantitative, pregnancy     Status: Abnormal   Collection Time: 09/25/15  2:31 AM  Result Value Ref Range   hCG, Beta Chain, Quant, S 73630 (H) <5 mIU/mL  CBC     Status: Abnormal   Collection Time: 09/25/15  2:31 AM  Result Value Ref Range   WBC 7.2 4.0 - 10.5 K/uL   RBC 3.88 3.87 - 5.11 MIL/uL   Hemoglobin 10.6 (L) 12.0 - 15.0 g/dL   HCT 40.9 (L) 81.1 - 91.4 %   MCV 81.2 78.0 - 100.0 fL   MCH 27.3 26.0 - 34.0 pg   MCHC 33.7 30.0 - 36.0 g/dL   RDW 78.2 95.6 - 21.3 %  Platelets 169 150 - 400 K/uL  Wet prep, genital     Status: Abnormal   Collection Time: 09/25/15  3:25 AM  Result Value Ref Range   Yeast Wet Prep HPF POC NONE SEEN NONE SEEN   Trich, Wet Prep NONE SEEN NONE SEEN   Clue Cells Wet Prep HPF POC NONE SEEN NONE SEEN   WBC, Wet Prep HPF POC FEW (A) NONE SEEN   Sperm NONE SEEN   Fern Test     Status: Normal   Collection Time: 09/25/15  3:56 AM  Result Value Ref Range   POCT Fern Test Negative = intact amniotic membranes     US: FINDINGS: Intrauterine gestational sac: Single  Yolk sac: Yes  Embryo: Yes  Cardiac Activity: Yes  Heart Rate: 152 bpm  MSD: mm w d  CRL: 14.9 mm 7 w 6 d US EDC: 05/07/2016  Subchorionic hemorrhage: None visualized.  Maternal uterus/adnexae: Normal ovaries. No free pelvic  fluid.  IMPRESSION: Single living intrauterine gestation measuring 7 weeks 6 days by crown-rump length.    SVe:   Closed/thick ;    Watery, mucousy,  brown tinged discharge Fern: Negative   Physical Exam  Constitutional: She is oriented to person, place, and time. She appears well-developed and well-nourished.  HENT:  Head: Normocephalic and atraumatic.  Neck: Normal range of motion.  Cardiovascular: Normal rate and regular rhythm.   Respiratory: Effort normal and breath sounds normal.  GI: Soft.  Genitourinary: Uterus is not tender. Cervix exhibits discharge. Cervix exhibits no motion tenderness and no friability. Right adnexum displays no tenderness. Left adnexum displays no tenderness. There is bleeding in the vagina. Vaginal discharge found.  Musculoskeletal: Normal range of motion.  Neurological: She is alert and oriented to person, place, and time.  Skin: Skin is warm and dry.  Psychiatric: She has a normal mood and affect. Her behavior is normal.    ED Course  Assessment: IUP  8wks First Trimester vaginal bleeding FHT  152 bpm, per US No Subchorionic hemorrhage visualized on US Quantitative HcG  73,630; wnl for gestational age Wet prep wnl Fern Negative GC/CH pending  Plan: DC home  Pelvic Rest Bleeding precautions Keep and go to schedule Prenatal visit on 10/01/15 Call PRN   Alphonzo Severanceachel Glade Strausser CNM, MSN 09/25/2015 4:17 AM

## 2015-09-25 NOTE — Discharge Instructions (Signed)

## 2015-09-25 NOTE — Progress Notes (Signed)
Written and verbal d/c instructions given and understanding voiced. 

## 2015-09-25 NOTE — MAU Note (Signed)
Went to BR at 0030 and had a lot of bleeding and passed a clot. Had mild cramping. Has been back to BR again but has not seen bleeding and no cramping.

## 2015-09-27 LAB — GC/CHLAMYDIA PROBE AMP (~~LOC~~) NOT AT ARMC
Chlamydia: NEGATIVE
NEISSERIA GONORRHEA: NEGATIVE

## 2016-02-15 LAB — OB RESULTS CONSOLE HIV ANTIBODY (ROUTINE TESTING): HIV: NONREACTIVE

## 2016-02-15 LAB — OB RESULTS CONSOLE GC/CHLAMYDIA
CHLAMYDIA, DNA PROBE: NEGATIVE
Gonorrhea: NEGATIVE

## 2016-02-15 LAB — OB RESULTS CONSOLE ABO/RH: RH Type: POSITIVE

## 2016-02-15 LAB — OB RESULTS CONSOLE RPR: RPR: NONREACTIVE

## 2016-02-15 LAB — OB RESULTS CONSOLE HEPATITIS B SURFACE ANTIGEN: HEP B S AG: NEGATIVE

## 2016-02-15 LAB — OB RESULTS CONSOLE ANTIBODY SCREEN: Antibody Screen: NEGATIVE

## 2016-02-15 LAB — OB RESULTS CONSOLE RUBELLA ANTIBODY, IGM: RUBELLA: IMMUNE

## 2016-04-11 LAB — OB RESULTS CONSOLE GBS: GBS: NEGATIVE

## 2016-04-28 ENCOUNTER — Inpatient Hospital Stay (HOSPITAL_COMMUNITY)
Admission: AD | Admit: 2016-04-28 | Discharge: 2016-04-30 | DRG: 775 | Disposition: A | Discharge status: Home / Self Care | Payer: BLUE CROSS/BLUE SHIELD | Source: Ambulatory Visit | Attending: Obstetrics & Gynecology | Admitting: Obstetrics & Gynecology

## 2016-04-28 ENCOUNTER — Encounter (HOSPITAL_COMMUNITY): Payer: Self-pay

## 2016-04-28 ENCOUNTER — Inpatient Hospital Stay (HOSPITAL_COMMUNITY): Payer: BLUE CROSS/BLUE SHIELD | Admitting: Anesthesiology

## 2016-04-28 DIAGNOSIS — Z3A39 39 weeks gestation of pregnancy: Secondary | ICD-10-CM

## 2016-04-28 DIAGNOSIS — O4290 Premature rupture of membranes, unspecified as to length of time between rupture and onset of labor, unspecified weeks of gestation: Secondary | ICD-10-CM | POA: Diagnosis present

## 2016-04-28 DIAGNOSIS — O4292 Full-term premature rupture of membranes, unspecified as to length of time between rupture and onset of labor: Secondary | ICD-10-CM | POA: Diagnosis present

## 2016-04-28 DIAGNOSIS — Z3A40 40 weeks gestation of pregnancy: Secondary | ICD-10-CM

## 2016-04-28 LAB — CBC
HEMATOCRIT: 31.9 % — AB (ref 36.0–46.0)
Hemoglobin: 10.6 g/dL — ABNORMAL LOW (ref 12.0–15.0)
MCH: 27.7 pg (ref 26.0–34.0)
MCHC: 33.2 g/dL (ref 30.0–36.0)
MCV: 83.5 fL (ref 78.0–100.0)
PLATELETS: 150 10*3/uL (ref 150–400)
RBC: 3.82 MIL/uL — ABNORMAL LOW (ref 3.87–5.11)
RDW: 13.6 % (ref 11.5–15.5)
WBC: 12.8 10*3/uL — AB (ref 4.0–10.5)

## 2016-04-28 LAB — TYPE AND SCREEN
ABO/RH(D): B POS
ANTIBODY SCREEN: NEGATIVE

## 2016-04-28 LAB — PROTEIN / CREATININE RATIO, URINE
Creatinine, Urine: 72 mg/dL
Protein Creatinine Ratio: 0.25 mg/mg{Cre} — ABNORMAL HIGH (ref 0.00–0.15)
Total Protein, Urine: 18 mg/dL

## 2016-04-28 LAB — POCT FERN TEST: POCT FERN TEST: POSITIVE

## 2016-04-28 MED ORDER — EPHEDRINE 5 MG/ML INJ
10.0000 mg | INTRAVENOUS | Status: DC | PRN
  Filled 2016-04-28: qty 4

## 2016-04-28 MED ORDER — LACTATED RINGERS IV SOLN
INTRAVENOUS | Status: DC
  Administered 2016-04-28: 18:00:00 via INTRAVENOUS

## 2016-04-28 MED ORDER — OXYCODONE-ACETAMINOPHEN 5-325 MG PO TABS: 2.0000 | ORAL_TABLET | ORAL | Status: DC | PRN

## 2016-04-28 MED ORDER — LACTATED RINGERS IV SOLN: 500.0000 mL | Freq: Once | INTRAVENOUS | Status: DC

## 2016-04-28 MED ORDER — TERBUTALINE SULFATE 1 MG/ML IJ SOLN
0.2500 mg | Freq: Once | INTRAMUSCULAR | Status: DC | PRN
  Filled 2016-04-28: qty 1

## 2016-04-28 MED ORDER — SOD CITRATE-CITRIC ACID 500-334 MG/5ML PO SOLN
30.0000 mL | ORAL | Status: DC | PRN
Start: 2016-04-28 — End: 2016-04-29

## 2016-04-28 MED ORDER — ONDANSETRON HCL 4 MG/2ML IJ SOLN: 4.0000 mg | Freq: Four times a day (QID) | INTRAMUSCULAR | Status: DC | PRN

## 2016-04-28 MED ORDER — PHENYLEPHRINE 40 MCG/ML (10ML) SYRINGE FOR IV PUSH (FOR BLOOD PRESSURE SUPPORT)
PREFILLED_SYRINGE | INTRAVENOUS | Status: AC
  Filled 2016-04-28: qty 10

## 2016-04-28 MED ORDER — PHENYLEPHRINE 40 MCG/ML (10ML) SYRINGE FOR IV PUSH (FOR BLOOD PRESSURE SUPPORT)
80.0000 ug | PREFILLED_SYRINGE | INTRAVENOUS | Status: DC | PRN
  Filled 2016-04-28: qty 5

## 2016-04-28 MED ORDER — LACTATED RINGERS IV SOLN: 500.0000 mL | INTRAVENOUS | Status: DC | PRN

## 2016-04-28 MED ORDER — OXYCODONE-ACETAMINOPHEN 5-325 MG PO TABS: 1.0000 | ORAL_TABLET | ORAL | Status: DC | PRN

## 2016-04-28 MED ORDER — FENTANYL 2.5 MCG/ML BUPIVACAINE 1/10 % EPIDURAL INFUSION (WH - ANES)
14.0000 mL/h | INTRAMUSCULAR | Status: DC | PRN
  Administered 2016-04-28: 14 mL/h via EPIDURAL

## 2016-04-28 MED ORDER — FENTANYL CITRATE (PF) 100 MCG/2ML IJ SOLN: 50.0000 ug | INTRAMUSCULAR | Status: DC | PRN

## 2016-04-28 MED ORDER — OXYTOCIN BOLUS FROM INFUSION: 500.0000 mL | Freq: Once | INTRAVENOUS | Status: DC

## 2016-04-28 MED ORDER — LIDOCAINE HCL (PF) 1 % IJ SOLN
30.0000 mL | INTRAMUSCULAR | Status: DC | PRN
  Filled 2016-04-28: qty 30

## 2016-04-28 MED ORDER — ACETAMINOPHEN 325 MG PO TABS: 650.0000 mg | ORAL_TABLET | ORAL | Status: DC | PRN

## 2016-04-28 MED ORDER — FENTANYL 2.5 MCG/ML BUPIVACAINE 1/10 % EPIDURAL INFUSION (WH - ANES)
INTRAMUSCULAR | Status: AC
  Filled 2016-04-28: qty 100

## 2016-04-28 MED ORDER — DIPHENHYDRAMINE HCL 50 MG/ML IJ SOLN: 12.5000 mg | INTRAMUSCULAR | Status: DC | PRN

## 2016-04-28 MED ORDER — LIDOCAINE HCL (PF) 1 % IJ SOLN
INTRAMUSCULAR | Status: DC | PRN
  Administered 2016-04-28 (×2): 7 mL via EPIDURAL

## 2016-04-28 MED ORDER — OXYTOCIN 40 UNITS IN LACTATED RINGERS INFUSION - SIMPLE MED
2.5000 [IU]/h | INTRAVENOUS | Status: DC
  Filled 2016-04-28: qty 1000

## 2016-04-28 MED ORDER — OXYTOCIN 40 UNITS IN LACTATED RINGERS INFUSION - SIMPLE MED
1.0000 m[IU]/min | INTRAVENOUS | Status: DC
  Administered 2016-04-28: 2 m[IU]/min via INTRAVENOUS

## 2016-04-28 NOTE — H&P (Signed)
Gabrielle Hill is a 40 y.o. female, G5P2021 at 38.6 weeks, presenting for SROM and contractions.  Prenatal hx of first trimester bleeding, and AMA.  Prior hx of Svd with shoulder dystocia. Wt was [facility] [employer] [facility].  US at 36 weeks fetus 60%.  Patient Active Problem List   Diagnosis Date Noted  . First trimester bleeding 09/25/2015  . Positive pregnancy test 09/01/2015  . Maternal age 38+, multigravida, antepartum   . Advanced maternal age in multigravida 06/30/2014  . 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 H/O cryosurgery of cervix complicating pregnancy 06/30/2014  . [facility]  . Anxiety 03/21/2011  . Migraine 12/07/2010    History of present pregnancy: Patient entered care at 6 weeks.   EDC of 05/06/2016 was established by US.   Anatomy scan:  20 weeks, with normal findings and an posterior placenta. Repeat scan at 28 and 36 weeks.  All anatomy seen.  EFW at 36 weeks 60%        Last evaluation:  04/27/2016  OB History    Gravida Para Term Preterm AB Living   5 2 2   2 1    SAB TAB Ectopic Multiple Live Births   2     0 1     Past Medical History:  Diagnosis Date  . Pregnancy induced hypertension    in first pregnancy 16 years ago   Past Surgical History:  Procedure Laterality Date  . DILATION AND EVACUATION N/A 05/23/2013   Procedure: DILATATION AND EVACUATION;  Surgeon: Zelphia CairoGretchen Adkins, MD;  Location: [facility];  Service: Gynecology;  Laterality: N/A;   Family History: family history includes Arthritis in her father; Cancer in her maternal grandmother and paternal grandmother; Dementia in her father; Drug abuse in her maternal aunt and maternal uncle; Sarcoidosis in her mother. Social History:  reports that she has never smoked. She does not have any smokeless tobacco history on file. She reports that she drinks alcohol. She reports that she does not use  drugs.   Prenatal Transfer Tool  Maternal Diabetes: No Genetic Screening: Normal Maternal Ultrasounds/Referrals: Normal Fetal Ultrasounds or other Referrals:  None Maternal Substance Abuse:  No Significant Maternal Medications:  None Significant Maternal Lab Results: None  TDAP yes Flu yes  ROS:  All 10 systems reviewed and negative.  No Known Allergies   Dilation: 4.5 Effacement (%): 60 Station: -3, -2 Exam by:: Ginnie Smartachel Schmidt RN Blood pressure 146/84, pulse 103, temperature 98.2 F (36.8 C), temperature source Oral, resp. rate 18, height 5\' 7"  (1.702 m), weight 117.9 kg (260 lb), last menstrual period 07/09/2015, unknown if currently breastfeeding.  Chest clear Heart RRR without murmur Abd gravid, NT, FH appropriate EFW 7  Pelvic: per RN Ext: slight edema +2/+2  FHR: Category 1 UCs:  5-8  Prenatal labs: ABO, Rh:  B positive Antibody:  Neg Rubella:  Immune RPR:   NR HBsAg:   Neg HIV:  NR  GBS:  Neg Sickle cell/Hgb electrophoresis:  AA Pap:  2016 neg GC:  Neg Chlamydia:  Neg Genetic screenings:  Neg Glucola: wnl Other:   Hgb 10.8,  at 28 weeks       Assessment/Plan: IUP at 38.6 with SROM clear fluid Cat 1 strip  Plan: Admit to Birthing Suite per consult with Sallye OberKulwa Routine [facility] orders Pain med/epidural prn   Henderson NewcomerNancy Jean ProtheroCNM, MSN 04/28/2016, 3:40 PM    2

## 2016-04-28 NOTE — Anesthesia Procedure Notes (Signed)
Epidural Patient location during procedure: OB Start time: 04/28/2016 5:20 PM End time: 04/28/2016 5:24 PM  Staffing Anesthesiologist: Leilani AbleHATCHETT, Mikaelah Trostle Performed: anesthesiologist   Preanesthetic Checklist Completed: patient identified, surgical consent, pre-op evaluation, timeout performed, IV checked, risks and benefits discussed and monitors and equipment checked  Epidural Patient position: sitting Prep: site prepped and draped and DuraPrep Patient monitoring: continuous pulse ox and blood pressure Approach: midline Location: L3-L4 Injection technique: LOR air  Needle:  Needle type: Tuohy  Needle gauge: 17 G Needle length: 9 cm and 9 Needle insertion depth: 9 cm Catheter type: closed end flexible Catheter size: 19 Gauge Catheter at skin depth: 14 cm Test dose: negative and Other  Assessment Sensory level: T9 Events: blood not aspirated, injection not painful, no injection resistance, negative IV test and no paresthesia  Additional Notes Reason for block:procedure for pain

## 2016-04-28 NOTE — Anesthesia Preprocedure Evaluation (Signed)
Anesthesia Evaluation  Patient identified by MRN, date of birth, ID band Patient awake    Reviewed: Allergy & Precautions, H&P , NPO status , Patient's Chart, lab work & pertinent test results  Airway Mallampati: II  TM Distance: >3 FB Neck ROM: full    Dental no notable dental hx.    Pulmonary neg pulmonary ROS,    Pulmonary exam normal        Cardiovascular hypertension, negative cardio ROS Normal cardiovascular exam     Neuro/Psych    GI/Hepatic negative GI ROS, Neg liver ROS,   Endo/Other  Morbid obesity  Renal/GU negative Renal ROS     Musculoskeletal   Abdominal (+) + obese,   Peds  Hematology negative hematology ROS (+)   Anesthesia Other Findings   Reproductive/Obstetrics (+) Pregnancy                             Anesthesia Physical Anesthesia Plan  ASA: III  Anesthesia Plan: Epidural   Post-op Pain Management:    Induction:   Airway Management Planned:   Additional Equipment:   Intra-op Plan:   Post-operative Plan:   Informed Consent: I have reviewed the patients History and Physical, chart, labs and discussed the procedure including the risks, benefits and alternatives for the proposed anesthesia with the patient or authorized representative who has indicated his/her understanding and acceptance.     Plan Discussed with:   Anesthesia Plan Comments:         Anesthesia Quick Evaluation

## 2016-04-28 NOTE — Progress Notes (Signed)
FHR tracing pulled up in OR and reviewed by Bernerd PhoNancy Prothero, cnm from when pt was in MAU.   Specifically FHR tracing at 1508.

## 2016-04-28 NOTE — Progress Notes (Addendum)
Gabrielle Hill MRN: [id number]  Subjective: -Care assumed of 40 y.o. [id number] at 38.6wks who presents for SROM and contractions. In room to meet acquaintance and discuss POC. Upon entrance, nurse informs provider of patient desire for MD delivery due to h/o shoulder dystocia. Patient reports comfort with epidural and has various q/c regarding delivery.  Objective: BP (!) 153/99   Pulse 97   Temp 98.3 F (36.8 C) (Oral)   Resp 18   Ht 5\' 7"  (1.702 m)   Wt 117.9 kg (260 lb)   LMP 07/09/2015   BMI 40.72 kg/m  No intake/output data recorded. No intake/output data recorded.  Fetal Monitoring: FHT: 145 bpm, Mod Var, -Decels, +Accels UC: Occasional graphed, irritability noted    Vaginal Exam: SVE:   Dilation: 5.5 Effacement (%): 80 Station: -3 Exam by:: Shanda BumpsJessica E CNM Membranes:SROM x 8hrs Internal Monitors: IUPC inserted  Augmentation/Induction: Pitocin:518mUn/min Cytotec: None  Assessment:  IUP at 38.6wks Cat I FT  Protracted Labor Vertex Asynclitic  H/O Shoulder Dystocia Elevated BP  Plan: -In depth discussion regarding shoulder dystocia history and provider management of situation.  Reassurances given that this provider has had experiences with SD and is comfortable with performing maneuvers.  Further informed that C/S could not be ruled out if progress not made  -Patient informed that MD (faculty) is in house for emergency situations.  Requests that MD be outside room in case needed.  Will discuss patient requests with Dr. Lynford HumphreySR and faculty attending -Discussed IUPC r/b, prior to insertion, including increased risk of infection and ability to adequately monitor quantity and strength of contractions. -IUPC inserted without any difficulty  -Position changes to promote fetal descent and rotation; implement use of peanut  -Dr. Lynford HumphreySR updated on patient status and informed of requests; advised to contact Dr. Despina HiddenEure regarding standby during pushing phase. -Will send PC Ratio for  evaluation and will perform other tests as appropriate based on PCR results -Continue other mgmt as ordered  Valma CavaJessica L Yunuen Mordan,MSN, CNM 04/28/2016, 8:37 PM

## 2016-04-28 NOTE — MAU Note (Signed)
SROM around 1:08 clear fluid, contractions, still gushing, does not know if dilated, spotting on Wednesday.

## 2016-04-28 NOTE — Anesthesia Pain Management Evaluation Note (Signed)
  CRNA Pain Management Visit Note  Patient: Gabrielle Hill, 40 y.o., female  "Hello I am a member of the anesthesia team at [facility]. We have an anesthesia team available at all times to provide care throughout the hospital, including epidural management and anesthesia for C-section. I don't know your plan for the delivery whether it a natural birth, water birth, IV sedation, nitrous supplementation, doula or epidural, but we want to meet your pain goals."   1.Was your pain managed to your expectations on prior hospitalizations?   Yes   2.What is your expectation for pain management during this hospitalization?     Epidural  3.How can we help you reach that goal? unsure  Record the patient's initial score and the patient's pain goal.   Pain: 7  Pain Goal: 9 [facility] wants you to be able to say your pain was always managed very well.  Cephus ShellingBURGER,Orlie Cundari 04/28/2016

## 2016-04-28 NOTE — Progress Notes (Signed)
Subjective: Epidural placed. Pt comfortable Objective: BP (!) 160/91   Pulse 87   Temp 98.2 F (36.8 C) (Oral)   Resp 18   Ht 5\' 7"  (1.702 m)   Wt 117.9 kg (260 lb)   LMP 07/09/2015   BMI 40.72 kg/m  No intake/output data recorded. No intake/output data recorded.  FHT: Category 1 UC:   irregular, every 5 to 8 minutes SVE:   5/80/-1 NP Pitocin at started   Assessment:  SROM x 4 hours Cat 1 strip  Plan: Start pitocin Anticipate SVD Kenney HousemanNancy Jean Prothero CNM, MSN 04/28/2016, 5:44 PM

## 2016-04-29 ENCOUNTER — Encounter (HOSPITAL_COMMUNITY): Payer: Self-pay

## 2016-04-29 LAB — URIC ACID: URIC ACID, SERUM: 4.7 mg/dL (ref 2.3–6.6)

## 2016-04-29 LAB — COMPREHENSIVE METABOLIC PANEL
ALBUMIN: 2.4 g/dL — AB (ref 3.5–5.0)
ALT: 17 U/L (ref 14–54)
AST: 23 U/L (ref 15–41)
Alkaline Phosphatase: 144 U/L — ABNORMAL HIGH (ref 38–126)
Anion gap: 6 (ref 5–15)
BILIRUBIN TOTAL: 0.4 mg/dL (ref 0.3–1.2)
BUN: 10 mg/dL (ref 6–20)
CHLORIDE: 104 mmol/L (ref 101–111)
CO2: 24 mmol/L (ref 22–32)
Calcium: 8.6 mg/dL — ABNORMAL LOW (ref 8.9–10.3)
Creatinine, Ser: 0.54 mg/dL (ref 0.44–1.00)
GFR calc Af Amer: >60 mL/min (ref >60)
GFR calc non Af Amer: >60 mL/min (ref >60)
GLUCOSE: 91 mg/dL (ref 65–99)
POTASSIUM: 4 mmol/L (ref 3.5–5.1)
SODIUM: 134 mmol/L — AB (ref 135–145)
Total Protein: 5.9 g/dL — ABNORMAL LOW (ref 6.5–8.1)

## 2016-04-29 LAB — CBC
HCT: 30.3 % — ABNORMAL LOW (ref 36.0–46.0)
Hemoglobin: 10 g/dL — ABNORMAL LOW (ref 12.0–15.0)
MCH: 27.5 pg (ref 26.0–34.0)
MCHC: 33 g/dL (ref 30.0–36.0)
MCV: 83.5 fL (ref 78.0–100.0)
Platelets: 138 10*3/uL — ABNORMAL LOW (ref 150–400)
RBC: 3.63 MIL/uL — ABNORMAL LOW (ref 3.87–5.11)
RDW: 13.5 % (ref 11.5–15.5)
WBC: 14.3 10*3/uL — ABNORMAL HIGH (ref 4.0–10.5)

## 2016-04-29 LAB — LACTATE DEHYDROGENASE: LDH: 173 U/L (ref 98–192)

## 2016-04-29 LAB — RPR: RPR Ser Ql: NONREACTIVE

## 2016-04-29 MED ORDER — BENZOCAINE-MENTHOL 20-0.5 % EX AERO
1.0000 "application " | INHALATION_SPRAY | CUTANEOUS | Status: DC | PRN
  Administered 2016-04-29: 1 via TOPICAL
  Filled 2016-04-29: qty 56

## 2016-04-29 MED ORDER — SENNOSIDES-DOCUSATE SODIUM 8.6-50 MG PO TABS
2.0000 | ORAL_TABLET | ORAL | Status: DC
  Administered 2016-04-29: 2 via ORAL
  Filled 2016-04-29: qty 2

## 2016-04-29 MED ORDER — ZOLPIDEM TARTRATE 5 MG PO TABS: 5.0000 mg | ORAL_TABLET | Freq: Every evening | ORAL | Status: DC | PRN

## 2016-04-29 MED ORDER — PRENATAL MULTIVITAMIN CH
1.0000 | ORAL_TABLET | Freq: Every day | ORAL | Status: DC
  Administered 2016-04-29: 1 via ORAL
  Filled 2016-04-29: qty 1

## 2016-04-29 MED ORDER — COCONUT OIL OIL: 1.0000 "application " | TOPICAL_OIL | Status: DC | PRN

## 2016-04-29 MED ORDER — SIMETHICONE 80 MG PO CHEW: 80.0000 mg | CHEWABLE_TABLET | ORAL | Status: DC | PRN

## 2016-04-29 MED ORDER — ACETAMINOPHEN 325 MG PO TABS
650.0000 mg | ORAL_TABLET | ORAL | Status: DC | PRN
  Administered 2016-04-29 (×2): 650 mg via ORAL
  Filled 2016-04-29 (×2): qty 2

## 2016-04-29 MED ORDER — DIPHENHYDRAMINE HCL 25 MG PO CAPS: 25.0000 mg | ORAL_CAPSULE | Freq: Four times a day (QID) | ORAL | Status: DC | PRN

## 2016-04-29 MED ORDER — ONDANSETRON HCL 4 MG/2ML IJ SOLN: 4.0000 mg | INTRAMUSCULAR | Status: DC | PRN

## 2016-04-29 MED ORDER — ONDANSETRON HCL 4 MG PO TABS: 4.0000 mg | ORAL_TABLET | ORAL | Status: DC | PRN

## 2016-04-29 MED ORDER — WITCH HAZEL-GLYCERIN EX PADS: 1.0000 "application " | MEDICATED_PAD | CUTANEOUS | Status: DC | PRN

## 2016-04-29 MED ORDER — IBUPROFEN 600 MG PO TABS
600.0000 mg | ORAL_TABLET | Freq: Four times a day (QID) | ORAL | Status: DC
  Administered 2016-04-29 – 2016-04-30 (×5): 600 mg via ORAL
  Filled 2016-04-29 (×5): qty 1

## 2016-04-29 MED ORDER — DIBUCAINE 1 % RE OINT: 1.0000 "application " | TOPICAL_OINTMENT | RECTAL | Status: DC | PRN

## 2016-04-29 MED ORDER — TETANUS-DIPHTH-ACELL PERTUSSIS 5-2.5-18.5 LF-MCG/0.5 IM SUSP: 0.5000 mL | Freq: Once | INTRAMUSCULAR | Status: DC

## 2016-04-29 NOTE — Lactation Note (Signed)
This note was copied from a baby's chart. Lactation Consultation Note  Patient Name: Gabrielle Hill [id number]'s Date: 04/29/2016 Reason for consult: Initial assessment Visited with Mom, third baby, term, baby 6414 hrs old.  Mom resting on her side while baby is sleeping swaddled in her crib.  Baby has fed 5 times already.  Mom states that baby opens and latches well.  Encouraged Mom to have baby STS during feedings, and feed her often on cue.   Brochure left in room.  Informed Mom of IP and OP Lactation services available to her.  LC to follow up prn and in am.  Consult Status Consult Status: Follow-up Date: 04/30/16 Follow-up type: In-patient    Judee ClaraSmith, Jael Kostick E 04/29/2016, 2:52 PM

## 2016-04-29 NOTE — Progress Notes (Signed)
Gabrielle Hill MRN: [id number]  Subjective: -Patient reports intermittent rectal pressure.  Also reports "shivering."    Objective: BP (!) 153/99   Pulse 97   Temp 98 F (36.7 C) (Oral)   Resp 16   Ht 5\' 7"  (1.702 m)   Wt 117.9 kg (260 lb)   LMP 07/09/2015   BMI 40.72 kg/m  No intake/output data recorded. Total I/O In: -  Out: 550 [Urine:550]  Fetal Monitoring: FHT: 145 bpm, Mod Var, +Occ. Variable Decels, +Accels UC: Q52min, MVUs 240mmHg    Vaginal Exam: SVE:   Dilation: 10 Effacement (%): 100 Station: +2 Exam by:: Gabrielle Hill CNM Membranes:SROM x 11 hrs Internal Monitors: IUPC  Augmentation/Induction: Pitocin:12Mun/min  Cytotec: None  Assessment:  IUP at 39wks Cat II FT  2nd Stage Labor H/O Shoulder Dystocia  Plan: -Will start pushing  -Dr. Despina HiddenEure on standby  -Anticipate SVD  Joyce CopaJessica L Marnette Perkins,MSN, CNM 04/29/2016, 12:14 AM

## 2016-04-30 NOTE — Progress Notes (Signed)
MOB was referred for history of depression/anxiety.  Referral is screened out by Clinical Social Worker because none of the following criteria appear to apply and there are no reports impacting the pregnancy or her transition to the postpartum period.  CSW does not deem it clinically necessary to further investigate at this time.   -History of anxiety/depression during this pregnancy, or of post-partum depression.  - Diagnosis of anxiety and/or depression within last 3 years.-  - History of depression due to pregnancy loss/loss of child or -MOB's symptoms are currently being treated with medication and/or therapy.  Please contact the Clinical Social Worker if needs arise or upon MOB request.    Gabrielle Hill, MSW, LCSW-A Clinical Social Worker  [facility]  Office: [phone]   

## 2016-04-30 NOTE — Discharge Summary (Signed)
OB Discharge Summary     Patient Name: Gabrielle Hill DOB: 12-15-1975 MRN: [id number]  Date of admission: 04/28/2016 Delivering MD: Gerrit HeckEMLY, JESSICA   Date of discharge: 04/30/2016  Admitting diagnosis: 39WKS,WATER BROKE Intrauterine pregnancy: 948w0d     Secondary diagnosis:  Active Problems:   Delayed delivery after SROM (spontaneous rupture of membranes)   SVD (spontaneous vaginal delivery)   First degree perineal laceration during delivery  Additional problems: None     Discharge diagnosis: Term Pregnancy Delivered                                                                                                Post partum procedures:None  Augmentation: Pitocin  Complications: None  Hospital course:  Onset of Labor With Vaginal Delivery     40 y.o. yo [id number] at 6748w0d was admitted in Active Labor on 04/28/2016. Patient had an uncomplicated labor course as follows:  Membrane Rupture Time/Date: 1:08 PM ,04/28/2016   Intrapartum Procedures: Episiotomy: None [1]                                         Lacerations:  1st degree [2]  Patient had a delivery of a Viable female infant. 04/29/2016  Information for the patient's newborn:  Gabrielle Hill, Gabrielle Hill [[id number]][[id number]]  Delivery Method: Vaginal, Spontaneous Delivery (Filed from Delivery Summary)    Pateint had an uncomplicated postpartum course.  She is ambulating, tolerating a regular diet, passing flatus, and urinating well. Patient is discharged home in stable condition on 04/30/16.    Physical exam Vitals:   04/29/16 0400 04/29/16 0915 04/29/16 1752 04/30/16 0546  BP: (!) 156/64 (!) 121/55 118/63 (!) 106/59  Pulse: (!) 101 97 82 70  Resp: 18 16 14 18   Temp: 98.8 F (37.1 C) 98.5 F (36.9 C) 98.2 F (36.8 C) 98.4 F (36.9 C)  TempSrc: Oral Oral Oral Oral  SpO2:   99%   Weight:      Height:       General: alert, cooperative and no distress Lochia: appropriate Uterine Fundus: firm Incision: N/A DVT Evaluation:  No evidence of DVT seen on physical exam. Negative Homan's sign. Labs: Lab Results  Component Value Date   WBC 14.3 (H) 04/29/2016   HGB 10.0 (L) 04/29/2016   HCT 30.3 (L) 04/29/2016   MCV 83.5 04/29/2016   PLT 138 (L) 04/29/2016   CMP Latest Ref Rng & Units 04/29/2016  Glucose 65 - 99 mg/dL 91  BUN 6 - 20 mg/dL 10  Creatinine 6.290.44 - 5.281.00 mg/dL 4.130.54  Sodium 244135 - 010145 mmol/L 134(L)  Potassium 3.5 - 5.1 mmol/L 4.0  Chloride 101 - 111 mmol/L 104  CO2 22 - 32 mmol/L 24  Calcium 8.9 - 10.3 mg/dL 2.7(O8.6(L)  Total Protein 6.5 - 8.1 g/dL 5.9(L)  Total Bilirubin 0.3 - 1.2 mg/dL 0.4  Alkaline Phos 38 - 126 U/L 144(H)  AST 15 - 41 U/L 23  ALT 14 - 54 U/L 17  Discharge instruction: per After Visit Summary and "Baby and Me Booklet".  After visit meds:    Medication List    TAKE these medications   ferrous sulfate 325 (65 FE) MG tablet Take 325 mg by mouth daily with breakfast.   prenatal multivitamin Tabs tablet Take 1 tablet by mouth daily at 12 noon.   Vitamin D 2000 units Caps Take 4,000 Units by mouth daily.       Diet: routine diet  Activity: Advance as tolerated. Pelvic rest for 6 weeks.   Outpatient follow up:6 weeks Follow up Appt:No future appointments. Follow up Visit:No Follow-up on file.  Postpartum contraception: Vasectomy  Newborn Data: Live born female  Birth Weight: 7 lb 13.8 oz (3565 g) APGAR: 7, 9  Baby Feeding: Breast Disposition:home with mother   04/30/2016 Gabrielle Hill, CNM

## 2016-05-02 NOTE — Anesthesia Postprocedure Evaluation (Signed)
Anesthesia Post Note  Patient: Gabrielle Hill  Procedure(s) Performed: * No procedures listed *  Patient location during evaluation: Mother Baby Anesthesia Type: Epidural Level of consciousness: awake and alert Pain management: pain level controlled Vital Signs Assessment: post-procedure vital signs reviewed and stable Respiratory status: spontaneous breathing, nonlabored ventilation and respiratory function stable Cardiovascular status: stable Postop Assessment: no headache, no backache and epidural receding Anesthetic complications: no    Last Vitals: There were no vitals filed for this visit.  Last Pain: There were no vitals filed for this visit.               Phillips Groutarignan, Tine Mabee

## 2016-05-30 ENCOUNTER — Ambulatory Visit: Payer: BLUE CROSS/BLUE SHIELD | Admitting: Internal Medicine

## 2016-07-31 ENCOUNTER — Encounter: Payer: Self-pay | Admitting: Internal Medicine

## 2016-10-06 ENCOUNTER — Encounter: Payer: Self-pay | Admitting: Internal Medicine

## 2016-10-06 ENCOUNTER — Ambulatory Visit (INDEPENDENT_AMBULATORY_CARE_PROVIDER_SITE_OTHER): Payer: BLUE CROSS/BLUE SHIELD | Admitting: Internal Medicine

## 2016-10-06 VITALS — BP 118/74 | HR 84 | Temp 99.5°F | Wt 186.0 lb

## 2016-10-06 DIAGNOSIS — B9789 Other viral agents as the cause of diseases classified elsewhere: Secondary | ICD-10-CM | POA: Diagnosis not present

## 2016-10-06 DIAGNOSIS — J069 Acute upper respiratory infection, unspecified: Secondary | ICD-10-CM

## 2016-10-06 MED ORDER — HYDROCODONE-HOMATROPINE 5-1.5 MG/5ML PO SYRP: 5.0000 mL | ORAL_SOLUTION | Freq: Three times a day (TID) | ORAL | 0 refills | Status: DC | PRN

## 2016-10-06 NOTE — Patient Instructions (Signed)
Cough, Adult  A cough helps to clear your throat and lungs. A cough may last only 2?3 weeks (acute), or it may last longer than 8 weeks (chronic). Many different things can cause a cough. A cough may be a sign of an illness or another medical condition.  Follow these instructions at home:  ? Pay attention to any changes in your cough.  ? Take medicines only as told by your doctor.  ? If you were prescribed an antibiotic medicine, take it as told by your doctor. Do not stop taking it even if you start to feel better.  ? Talk with your doctor before you try using a cough medicine.  ? Drink enough fluid to keep your pee (urine) clear or pale yellow.  ? If the air is dry, use a cold steam vaporizer or humidifier in your home.  ? Stay away from things that make you cough at work or at home.  ? If your cough is worse at night, try using extra pillows to raise your head up higher while you sleep.  ? Do not smoke, and try not to be around smoke. If you need help quitting, ask your doctor.  ? Do not have caffeine.  ? Do not drink alcohol.  ? Rest as needed.  Contact a doctor if:  ? You have new problems (symptoms).  ? You cough up yellow fluid (pus).  ? Your cough does not get better after 2?3 weeks, or your cough gets worse.  ? Medicine does not help your cough and you are not sleeping well.  ? You have pain that gets worse or pain that is not helped with medicine.  ? You have a fever.  ? You are losing weight and you do not know why.  ? You have night sweats.  Get help right away if:  ? You cough up blood.  ? You have trouble breathing.  ? Your heartbeat is very fast.  This information is not intended to replace advice given to you by your health care provider. Make sure you discuss any questions you have with your health care provider.  Document Released: 01/19/2011 Document Revised: 10/14/2015 Document Reviewed: 07/15/2014  Elsevier Interactive Patient Education ? 2017 Elsevier Inc[employer]

## 2016-10-06 NOTE — Progress Notes (Signed)
HPI  Pt presents to the clinic today with c/o cough and chest congestion. She reports this started 4 days ago. The cough is non productive. She denies runny nose, nasal congestion, sore throat or shortness of breath. She denies fever, chills or body aches. She has tried Delsym and Nyquil without much relief. She has had sick contacts diagnosed with bronchitis.  Review of Systems        Past Medical History:  Diagnosis Date  . Pregnancy induced hypertension    in first pregnancy 16 years ago    Family History  Problem Relation Age of Onset  . Sarcoidosis Mother        s/p lung transplant, post op diabetes  . Arthritis Father   . Dementia Father   . Drug abuse Maternal Aunt   . Drug abuse Maternal Uncle   . Cancer Maternal Grandmother   . Cancer Paternal Grandmother   . Migraines Neg Hx     Social History   Social History  . Marital status: Married    Spouse name: N/A  . Number of children: 1  . Years of education: N/A   Occupational History  . journalist    Social History Main Topics  . Smoking status: Never Smoker  . Smokeless tobacco: Never Used  . Alcohol use 0.0 oz/week     Comment: Occasionally  . Drug use: No  . Sexual activity: Yes   Other Topics Concern  . Not on file   Social History Narrative   G1P1, daughter.  Uncomplicated pregnancy and delivery.   Regular exercise.     Lives in [place]   No surgeries.    No Known Allergies   Constitutional: Denies headache, fatigue, fever or abrupt weight changes.  HEENT:  Denies eye redness, eye pain, pressure behind the eyes, facial pain, nasal congestion, ear pain, ringing in the ears, wax buildup, runny nose or sore throat. Respiratory: Positive cough. Denies difficulty breathing or shortness of breath.  Cardiovascular: Denies chest pain, chest tightness, palpitations or swelling in the hands or feet.   No other specific complaints in a complete review of systems (except as listed in HPI  above).  Objective:  BP 118/74   Pulse 84   Temp 99.5 F (37.5 C) (Oral)   Wt 186 lb (84.4 kg)   SpO2 97%   BMI 29.13 kg/m   Wt Readings from Last 3 Encounters:  10/06/16 186 lb (84.4 kg)  04/28/16 260 lb (117.9 kg)  09/25/15 200 lb (90.7 kg)     General: Appears her stated age, in NAD. HEENT: Head: normal shape and size, no sinus tenderness noted; Ears: Tm's gray and intact, normal light reflex; Throat/Mouth: + PND. Teeth present, mucosa pink and moist, no exudate noted, no lesions or ulcerations noted.  Neck: No cervical lymphadenopathy.  Cardiovascular: Normal rate and rhythm. Pulmonary/Chest: Normal effort and positive vesicular breath sounds. No respiratory distress. No wheezes, rales or ronchi noted.       Assessment & Plan:   Viral Upper Respiratory Infection with Cough:  Get some rest and drink plenty of water Start Mucinex 600 mg BID Rx for Hycodan cough syrup  RTC as needed or if symptoms persist.   Nicki ReaperBAITY, Ameira Alessandrini, NP

## 2016-12-21 IMAGING — US US FETAL BPP W/O NONSTRESS
1 series · 13 of 21 positions shown · non-contrast
Comparison: none

[Series 1: us fetal bpp w/o nonstress · non-contrast · 21 acquisitions, 13 frames shown]
[im 1/21]
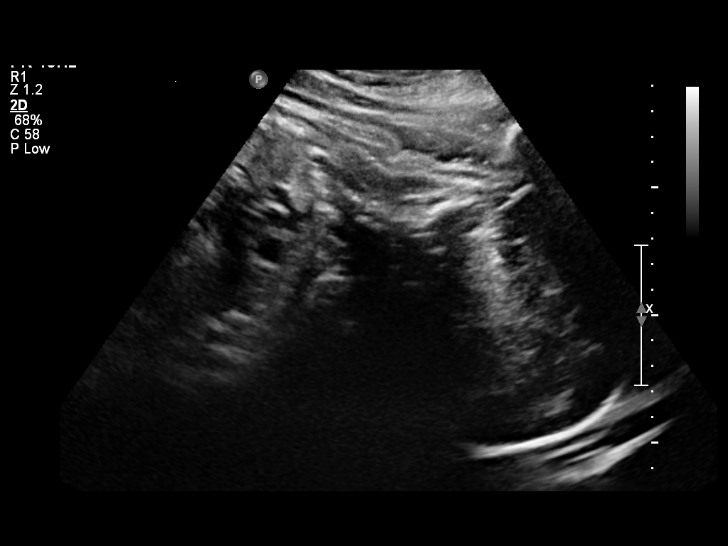
[im 3/21]
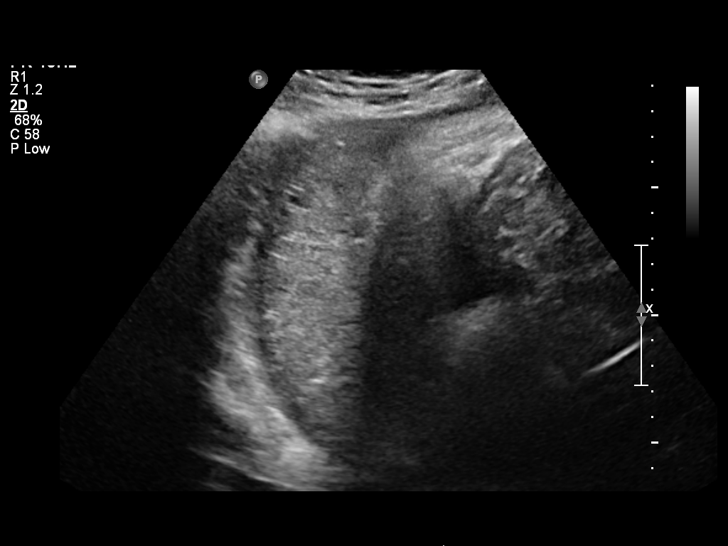
[im 5/21]
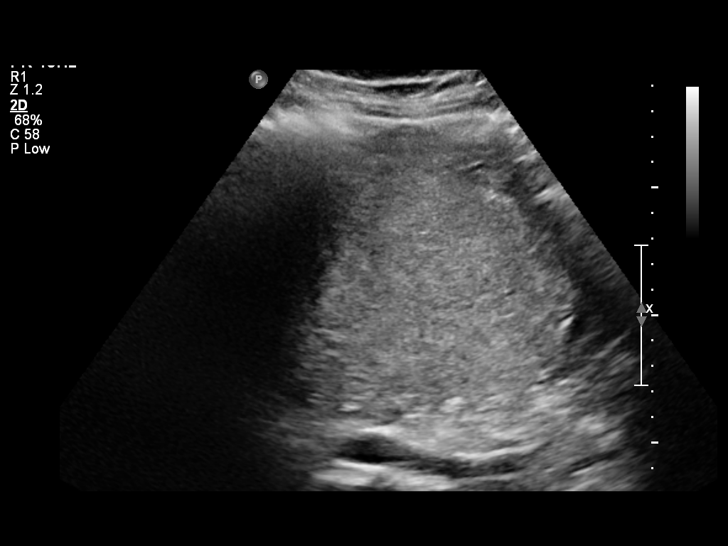
[im 6/21]
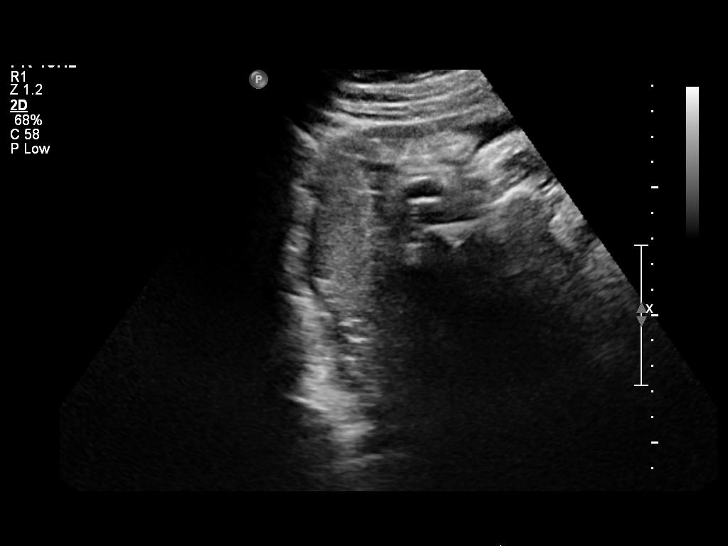
[im 8/21]
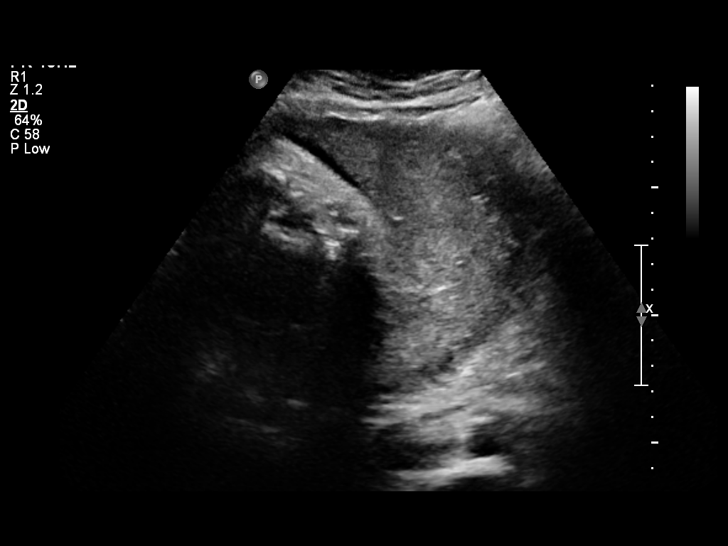
[im 9/21]
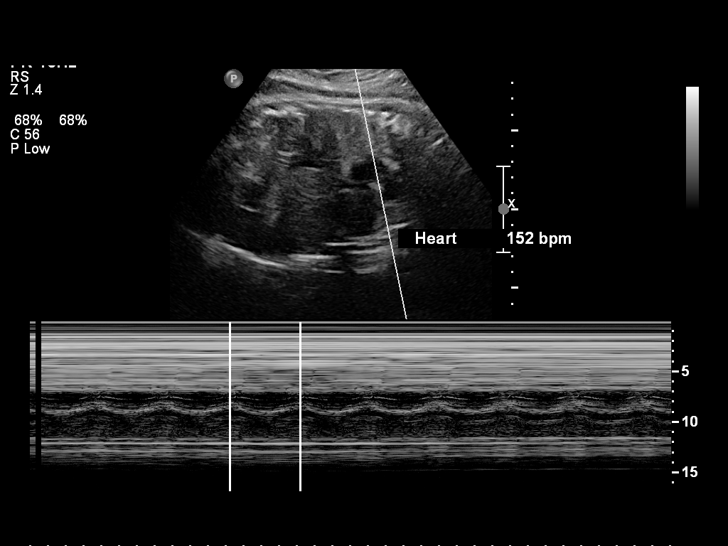
[im 11/21]
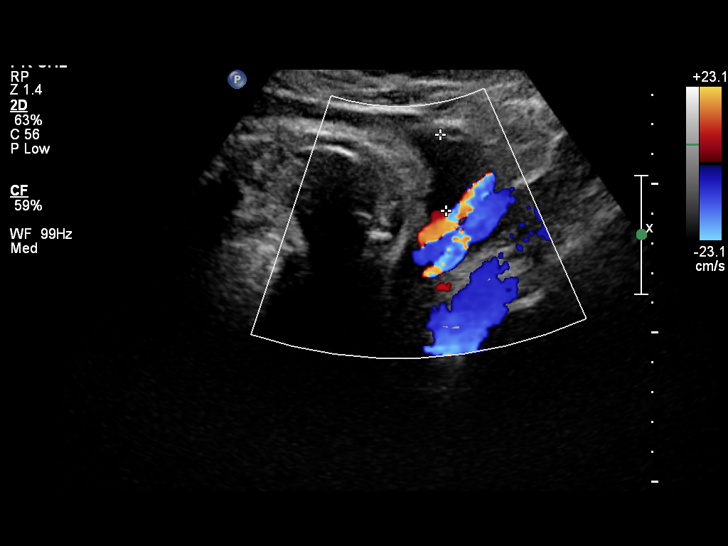
[im 13/21]
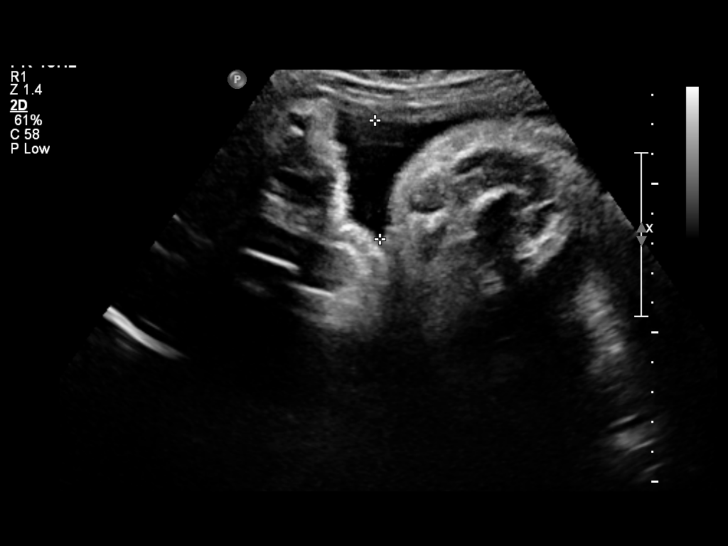
[im 14/21]
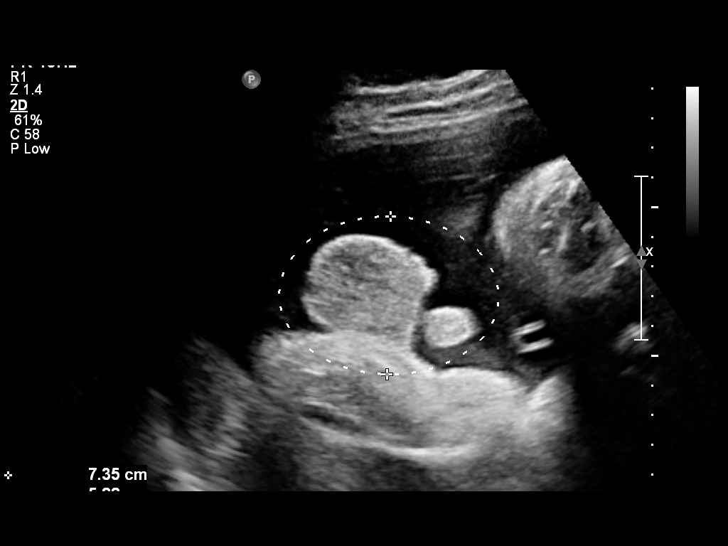
[im 16/21]
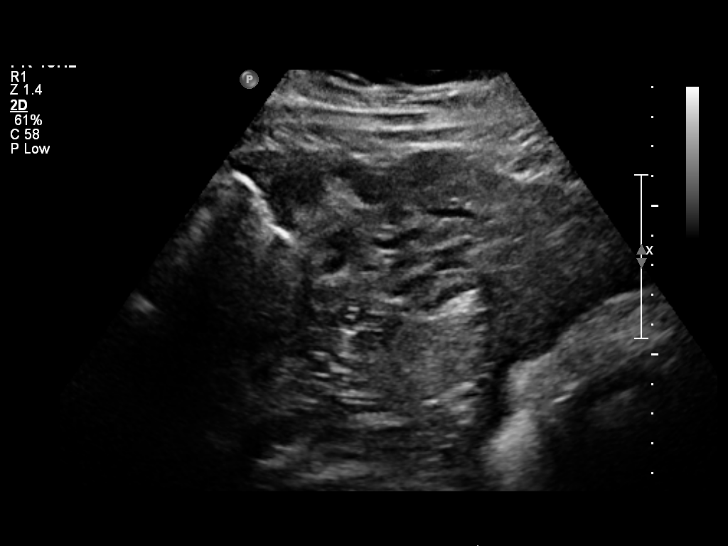
[im 17/21]
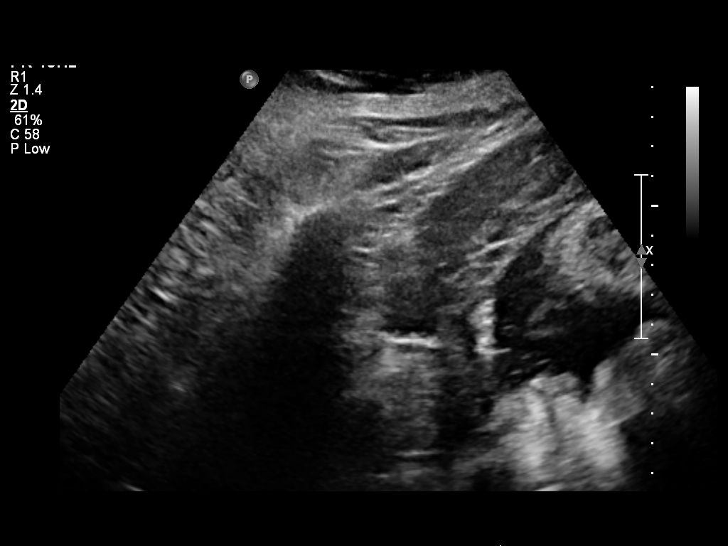
[im 19/21]
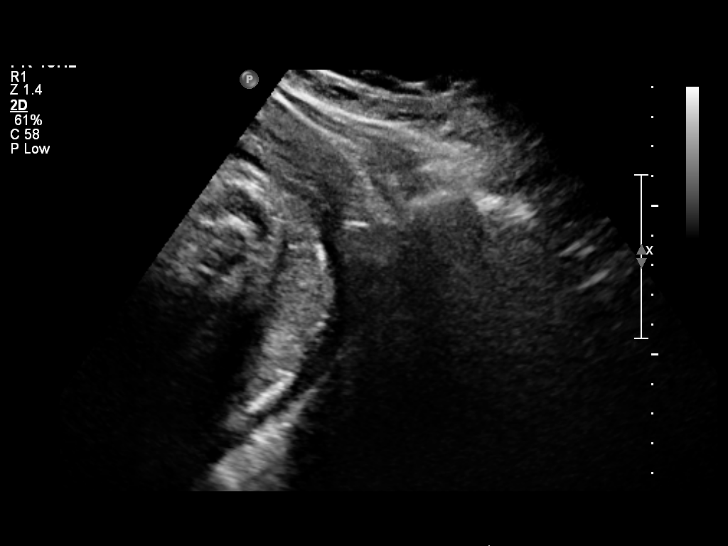
[im 21/21]
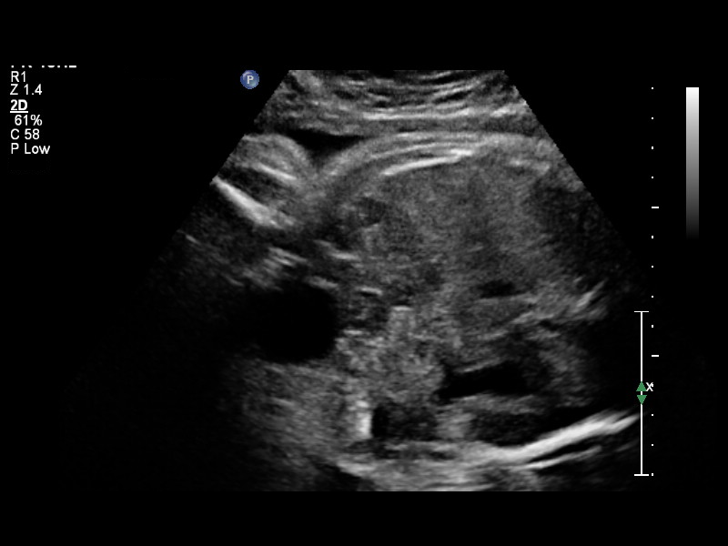

[13 of 21 positions shown; findings below may reference images not displayed]

OBSTETRICS REPORT
                      (Signed Final 07/10/2014 [DATE])

Service(s) Provided

 [HOSPITAL]                                         76815.0
Indications

 Decreased fetal movement
 Advanced maternal age primigravida 35+, third
 trimester
 40 weeks gestation of pregnancy
Fetal Evaluation

 Num Of Fetuses:    1
 Fetal Heart Rate:  152                          bpm
 Cardiac Activity:  Observed
 Presentation:      Cephalic
 Placenta:          Fundal, above cervical os

 Amniotic Fluid
 AFI FV:      Subjectively within normal limits
 AFI Sum:     15.86   cm       68  %Tile     Larg Pckt:    5.74  cm
 RUQ:   3.98    cm   RLQ:    3.59   cm    LUQ:   5.74    cm   LLQ:    2.55   cm
Biophysical Evaluation

 Amniotic F.V:   Within normal limits       F. Tone:        Observed
 F. Movement:    Observed                   Score:          [DATE]
 F. Breathing:   Observed
Gestational Age

 Clinical EDD:  40w 0d                                        EDD:   07/10/14
 Best:          40w 0d     Det. By:  Clinical EDD             EDD:   07/10/14
Cervix Uterus Adnexa

 Cervix:       Not visualized (advanced GA >09wks)

 Left Ovary:    Not visualized.
 Right Ovary:   Not visualized.
Impression

 SIUP at 40+0 weeks
 Cephalic presentation
 Normal amniotic fluid volume
 BPP [DATE]
Recommendations

 Follow-up as clinically indicated

 questions or concerns.

## 2017-03-02 ENCOUNTER — Ambulatory Visit (INDEPENDENT_AMBULATORY_CARE_PROVIDER_SITE_OTHER): Payer: BLUE CROSS/BLUE SHIELD | Admitting: Internal Medicine

## 2017-03-02 ENCOUNTER — Encounter: Payer: Self-pay | Admitting: Internal Medicine

## 2017-03-02 VITALS — BP 110/70 | HR 68 | Temp 98.3°F | Wt 189.0 lb

## 2017-03-02 DIAGNOSIS — J039 Acute tonsillitis, unspecified: Secondary | ICD-10-CM | POA: Diagnosis not present

## 2017-03-02 DIAGNOSIS — J029 Acute pharyngitis, unspecified: Secondary | ICD-10-CM

## 2017-03-02 LAB — POCT RAPID STREP A (OFFICE): RAPID STREP A SCREEN: NEGATIVE

## 2017-03-02 MED ORDER — AMOXICILLIN-POT CLAVULANATE 875-125 MG PO TABS: 1.0000 | ORAL_TABLET | Freq: Two times a day (BID) | ORAL | 0 refills | Status: AC

## 2017-03-02 NOTE — Addendum Note (Signed)
Addended by: Eual Fines on: 03/02/2017 11:34 AM   Modules accepted: Orders

## 2017-03-02 NOTE — Assessment & Plan Note (Signed)
Fairly classic presentation for strep--but rapid test is negative Will treat with augmentin--given the marked unilateral presentation (risk for abscess) Will send culture and stop antibiotics if negative

## 2017-03-02 NOTE — Progress Notes (Signed)
   Subjective:    Patient ID: Gabrielle Hill, female    DOB: 05/05/1976, 41 y.o.   MRN: [id number]  HPI Here due to sore throat  Has burning sensation Now also with pain in ears Neck is sore and tender Started 3 days ago---but noticed white spots this morning No cough Not really SOB --but feels she needs to pause at times No fever---but felt hot and cold. Acetaminophen has helped  No other treatment other than cough drops  Current Outpatient Prescriptions on File Prior to Visit  Medication Sig Dispense Refill  . levonorgestrel (MIRENA) 20 MCG/24HR IUD 1 each by Intrauterine route once. Inserted 07/2016     No current facility-administered medications on file prior to visit.     No Known Allergies  Past Medical History:  Diagnosis Date  . Pregnancy induced hypertension    in first pregnancy 16 years ago    Past Surgical History:  Procedure Laterality Date  . DILATION AND EVACUATION N/A 05/23/2013   Procedure: DILATATION AND EVACUATION;  Surgeon: Zelphia Cairo, MD;  Location: [facility];  Service: Gynecology;  Laterality: N/A;    Family History  Problem Relation Age of Onset  . Sarcoidosis Mother        s/p lung transplant, post op diabetes  . Arthritis Father   . Dementia Father   . Drug abuse Maternal Aunt   . Drug abuse Maternal Uncle   . Cancer Maternal Grandmother   . Cancer Paternal Grandmother   . Migraines Neg Hx     Social History   Social History  . Marital status: Married    Spouse name: N/A  . Number of children: 1  . Years of education: N/A   Occupational History  . journalist    Social History Main Topics  . Smoking status: Never Smoker  . Smokeless tobacco: Never Used  . Alcohol use 0.0 oz/week     Comment: Occasionally  . Drug use: No  . Sexual activity: Yes   Other Topics Concern  . Not on file   Social History Narrative   G1P1, daughter.  Uncomplicated pregnancy and delivery.   Regular exercise.     Lives in [place]   No  surgeries.   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  HENT:  No sinus tenderness TMs normal Mild nasal congestion Large right tonsil with inflammation and exudate. No apparent abscess   Neck:  Tender bilateral anterior cervical nodes  Pulmonary/Chest: Effort normal and breath sounds normal. No respiratory distress. She has no wheezes. She has no rales.  Skin: No rash noted. No erythema.          Assessment & Plan:

## 2017-03-04 LAB — CULTURE, GROUP A STREP
MICRO NUMBER:: 81140337
SPECIMEN QUALITY:: ADEQUATE

## 2017-04-03 ENCOUNTER — Ambulatory Visit (INDEPENDENT_AMBULATORY_CARE_PROVIDER_SITE_OTHER): Payer: BLUE CROSS/BLUE SHIELD

## 2017-04-03 DIAGNOSIS — Z23 Encounter for immunization: Secondary | ICD-10-CM | POA: Diagnosis not present

## 2017-07-25 ENCOUNTER — Ambulatory Visit: Payer: BLUE CROSS/BLUE SHIELD | Admitting: Nurse Practitioner

## 2017-07-25 ENCOUNTER — Ambulatory Visit (INDEPENDENT_AMBULATORY_CARE_PROVIDER_SITE_OTHER): Payer: BLUE CROSS/BLUE SHIELD | Admitting: Nurse Practitioner

## 2017-07-25 ENCOUNTER — Encounter: Payer: Self-pay | Admitting: Nurse Practitioner

## 2017-07-25 VITALS — BP 104/60 | HR 100 | Temp 98.0°F | Ht 67.0 in | Wt 182.0 lb

## 2017-07-25 DIAGNOSIS — J0100 Acute maxillary sinusitis, unspecified: Secondary | ICD-10-CM

## 2017-07-25 DIAGNOSIS — J039 Acute tonsillitis, unspecified: Secondary | ICD-10-CM

## 2017-07-25 DIAGNOSIS — J9801 Acute bronchospasm: Secondary | ICD-10-CM

## 2017-07-25 LAB — POCT RAPID STREP A (OFFICE): Rapid Strep A Screen: NEGATIVE

## 2017-07-25 MED ORDER — METHYLPREDNISOLONE ACETATE 40 MG/ML IJ SUSP
40.0000 mg | Freq: Once | INTRAMUSCULAR | Status: AC
  Administered 2017-07-25: 40 mg via INTRAMUSCULAR

## 2017-07-25 MED ORDER — SALINE SPRAY 0.65 % NA SOLN
1.0000 | NASAL | 0 refills | Status: DC | PRN
Start: 2017-07-25 — End: 2018-07-09

## 2017-07-25 MED ORDER — ALBUTEROL SULFATE (2.5 MG/3ML) 0.083% IN NEBU
2.5000 mg | INHALATION_SOLUTION | Freq: Once | RESPIRATORY_TRACT | Status: AC
  Administered 2017-07-25: 2.5 mg via RESPIRATORY_TRACT

## 2017-07-25 MED ORDER — DM-GUAIFENESIN ER 30-600 MG PO TB12: 1.0000 | ORAL_TABLET | Freq: Two times a day (BID) | ORAL | 0 refills | Status: DC | PRN

## 2017-07-25 MED ORDER — ALBUTEROL SULFATE HFA 108 (90 BASE) MCG/ACT IN AERS: 1.0000 | INHALATION_SPRAY | Freq: Four times a day (QID) | RESPIRATORY_TRACT | 0 refills | Status: DC | PRN

## 2017-07-25 MED ORDER — FLUTICASONE PROPIONATE 50 MCG/ACT NA SUSP: 2.0000 | Freq: Every day | NASAL | 0 refills | Status: DC

## 2017-07-25 MED ORDER — AMOXICILLIN-POT CLAVULANATE 875-125 MG PO TABS: 1.0000 | ORAL_TABLET | Freq: Two times a day (BID) | ORAL | 0 refills | Status: DC

## 2017-07-25 MED ORDER — HYDROCODONE-HOMATROPINE 5-1.5 MG/5ML PO SYRP: 5.0000 mL | ORAL_SOLUTION | Freq: Three times a day (TID) | ORAL | 0 refills | Status: DC | PRN

## 2017-07-25 NOTE — Patient Instructions (Signed)
Push oral hydration.   call office if no improvement in 1week.  Laryngitis Laryngitis is inflammation of your vocal cords. This causes hoarseness, coughing, loss of voice, sore throat, or a dry throat. Your vocal cords are two bands of muscles that are found in your throat. When you speak, these cords come together and vibrate. These vibrations come out through your mouth as sound. When your vocal cords are inflamed, your voice sounds different. Laryngitis can be temporary (acute) or long-term (chronic). Most cases of acute laryngitis improve with time. Chronic laryngitis is laryngitis that lasts for more than three weeks. What are the causes? Acute laryngitis may be caused by:  A viral infection.  Lots of talking, yelling, or singing. This is also called vocal strain.  Bacterial infections.  Chronic laryngitis may be caused by:  Vocal strain.  Injury to your vocal cords.  Acid reflux (gastroesophageal reflux disease or GERD).  Allergies.  Sinus infection.  Smoking.  Alcohol abuse.  Breathing in chemicals or dust.  Growths on the vocal cords.  What increases the risk? Risk factors for laryngitis include:  Smoking.  Alcohol abuse.  Having allergies.  What are the signs or symptoms? Symptoms of laryngitis may include:  Low, hoarse voice.  Loss of voice.  Dry cough.  Sore throat.  Stuffy nose.  How is this diagnosed? Laryngitis may be diagnosed by:  Physical exam.  Throat culture.  Blood test.  Laryngoscopy. This procedure allows your health care provider to look at your vocal cords with a mirror or viewing tube.  How is this treated? Treatment for laryngitis depends on what is causing it. Usually, treatment involves resting your voice and using medicines to soothe your throat. However, if your laryngitis is caused by a bacterial infection, you may need to take antibiotic medicine. If your laryngitis is caused by a growth, you may need to have a  procedure to remove it. Follow these instructions at home:  Drink enough fluid to keep your urine clear or pale yellow.  Breathe in moist air. Use a humidifier if you live in a dry climate.  Take medicines only as directed by your health care provider.  If you were prescribed an antibiotic medicine, finish it all even if you start to feel better.  Do not smoke cigarettes or electronic cigarettes. If you need help quitting, ask your health care provider.  Talk as little as possible. Also avoid whispering, which can cause vocal strain.  Write instead of talking. Do this until your voice is back to normal. Contact a health care provider if:  You have a fever.  You have increasing pain.  You have difficulty swallowing. Get help right away if:  You cough up blood.  You have trouble breathing. This information is not intended to replace advice given to you by your health care provider. Make sure you discuss any questions you have with your health care provider. Document Released: 05/08/2005 Document Revised: 10/14/2015 Document Reviewed: 10/21/2013 Elsevier Interactive Patient Education  [employer]2018 [employer]vier Inc[employer]

## 2017-07-25 NOTE — Progress Notes (Signed)
Subjective:  Patient ID: Gabrielle Hill, female    DOB: March 13, 1976  Age: [age] y.o. MRN: [id number]  CC: Cough (cough for 1 week. lost voice overnight.Throat burns.Productive cough with yellow mucous. Whole family Dx with URI.)  Cough  This is a new problem. The current episode started in the past 7 days. The problem has been gradually worsening. The cough is productive of purulent sputum. Associated symptoms include chest pain, chills, ear congestion, ear pain, headaches, myalgias, nasal congestion, postnasal drip, rhinorrhea and a sore throat. Pertinent negatives include no shortness of breath or wheezing. The symptoms are aggravated by lying down. She has tried OTC cough suppressant for the symptoms. The treatment provided no relief.   Outpatient Medications Prior to Visit  Medication Sig Dispense Refill  . levonorgestrel (MIRENA) 20 MCG/24HR IUD 1 each by Intrauterine route once. Inserted 07/2016     No facility-administered medications prior to visit.    ROS See HPI  Objective:  BP 104/60   Pulse 100   Temp 98 F (36.7 C) (Oral)   Ht 5\' 7"  (1.702 m)   Wt 182 lb (82.6 kg)   SpO2 96%   Breastfeeding? No   BMI 28.51 kg/m   BP Readings from Last 3 Encounters:  07/25/17 104/60  03/02/17 110/70  10/06/16 118/74    Wt Readings from Last 3 Encounters:  07/25/17 182 lb (82.6 kg)  03/02/17 189 lb (85.7 kg)  10/06/16 186 lb (84.4 kg)    Physical Exam  Constitutional: She is oriented to person, place, and time. No distress.  HENT:  Right Ear: Tympanic membrane, external ear and ear canal normal.  Left Ear: Tympanic membrane, external ear and ear canal normal.  Nose: Mucosal edema and rhinorrhea present. Right sinus exhibits maxillary sinus tenderness. Right sinus exhibits no frontal sinus tenderness. Left sinus exhibits maxillary sinus tenderness. Left sinus exhibits no frontal sinus tenderness.  Mouth/Throat: Uvula is midline. No trismus in the jaw. Oropharyngeal exudate and  posterior oropharyngeal erythema present.    Eyes: No scleral icterus.  Neck: Normal range of motion. Neck supple.  Cardiovascular: Normal rate and normal heart sounds.  Pulmonary/Chest: Effort normal. No respiratory distress. She has wheezes.  Musculoskeletal: She exhibits no edema.  Lymphadenopathy:    She has cervical adenopathy.  Neurological: She is alert and oriented to person, place, and time.  Psychiatric: She has a normal mood and affect. Her behavior is normal.  Vitals reviewed.   Lab Results  Component Value Date   WBC 14.3 (H) 04/29/2016   HGB 10.0 (L) 04/29/2016   HCT 30.3 (L) 04/29/2016   PLT 138 (L) 04/29/2016   GLUCOSE 91 04/29/2016   CHOL 143 12/23/2010   TRIG 55 12/23/2010   HDL 57 12/23/2010   LDLCALC 75 12/23/2010   ALT 17 04/29/2016   AST 23 04/29/2016   NA 134 (L) 04/29/2016   K 4.0 04/29/2016   CL 104 04/29/2016   CREATININE 0.54 04/29/2016   BUN 10 04/29/2016   CO2 24 04/29/2016   TSH 2.23 03/21/2011    Assessment & Plan:   Gabrielle Hill was seen today for cough.  Diagnoses and all orders for this visit:  Acute bronchospasm -     albuterol (PROVENTIL) (2.5 MG/3ML) 0.083% nebulizer solution 2.5 mg -     HYDROcodone-homatropine (HYCODAN) 5-1.5 MG/5ML syrup; Take 5 mLs by mouth every 8 (eight) hours as needed for cough. -     albuterol (PROVENTIL HFA;VENTOLIN HFA) 108 (90 Base) MCG/ACT inhaler; Inhale  1-2 puffs into the lungs every 6 (six) hours as needed. -     methylPREDNISolone acetate (DEPO-MEDROL) injection 40 mg  Tonsillitis -     POCT rapid strep A -     amoxicillin-clavulanate (AUGMENTIN) 875-125 MG tablet; Take 1 tablet by mouth 2 (two) times daily.  Acute non-recurrent maxillary sinusitis -     HYDROcodone-homatropine (HYCODAN) 5-1.5 MG/5ML syrup; Take 5 mLs by mouth every 8 (eight) hours as needed for cough. -     albuterol (PROVENTIL HFA;VENTOLIN HFA) 108 (90 Base) MCG/ACT inhaler; Inhale 1-2 puffs into the lungs every 6 (six) hours as  needed. -     dextromethorphan-guaiFENesin (MUCINEX DM) 30-600 MG 12hr tablet; Take 1 tablet by mouth 2 (two) times daily as needed for cough. -     fluticasone (FLONASE) 50 MCG/ACT nasal spray; Place 2 sprays into both nostrils daily. -     sodium chloride (OCEAN) 0.65 % SOLN nasal spray; Place 1 spray into both nostrils as needed for congestion. -     amoxicillin-clavulanate (AUGMENTIN) 875-125 MG tablet; Take 1 tablet by mouth 2 (two) times daily. -     methylPREDNISolone acetate (DEPO-MEDROL) injection 40 mg   I am having Gabrielle Hill. Gabrielle Hill start on HYDROcodone-homatropine, albuterol, dextromethorphan-guaiFENesin, fluticasone, sodium chloride, and amoxicillin-clavulanate. [name]. We will continue to administer albuterol and methylPREDNISolone acetate.  Meds ordered this encounter  Medications  . albuterol (PROVENTIL) (2.5 MG/3ML) 0.083% nebulizer solution 2.5 mg  . HYDROcodone-homatropine (HYCODAN) 5-1.5 MG/5ML syrup    Sig: Take 5 mLs by mouth every 8 (eight) hours as needed for cough.    Dispense:  60 mL    Refill:  0    Order Specific Question:   Supervising Provider    Answer:   Dianne Dun [[id number]]  . albuterol (PROVENTIL HFA;VENTOLIN HFA) 108 (90 Base) MCG/ACT inhaler    Sig: Inhale 1-2 puffs into the lungs every 6 (six) hours as needed.    Dispense:  1 Inhaler    Refill:  0    Order Specific Question:   Supervising Provider    Answer:   Dianne Dun [[id number]]  . dextromethorphan-guaiFENesin (MUCINEX DM) 30-600 MG 12hr tablet    Sig: Take 1 tablet by mouth 2 (two) times daily as needed for cough.    Dispense:  14 tablet    Refill:  0    Order Specific Question:   Supervising Provider    Answer:   Dianne Dun [[id number]]  . fluticasone (FLONASE) 50 MCG/ACT nasal spray    Sig: Place 2 sprays into both nostrils daily.    Dispense:  16 g    Refill:  0    Order Specific Question:   Supervising Provider    Answer:   Dianne Dun [[id number]]  .  sodium chloride (OCEAN) 0.65 % SOLN nasal spray    Sig: Place 1 spray into both nostrils as needed for congestion.    Dispense:  15 mL    Refill:  0    Order Specific Question:   Supervising Provider    Answer:   Dianne Dun [[id number]]  . amoxicillin-clavulanate (AUGMENTIN) 875-125 MG tablet    Sig: Take 1 tablet by mouth 2 (two) times daily.    Dispense:  14 tablet    Refill:  0    Order Specific Question:   Supervising Provider    Answer:   Dianne Dun [[id number]]  . methylPREDNISolone acetate (DEPO-MEDROL)  injection 40 mg    Follow-up: Return if symptoms worsen or fail to improve.  Alysia Penna, NP

## 2017-11-26 ENCOUNTER — Encounter: Payer: Self-pay | Admitting: Family Medicine

## 2017-11-26 ENCOUNTER — Ambulatory Visit (INDEPENDENT_AMBULATORY_CARE_PROVIDER_SITE_OTHER): Payer: BLUE CROSS/BLUE SHIELD | Admitting: Family Medicine

## 2017-11-26 VITALS — BP 118/72 | HR 68 | Temp 97.7°F | Ht 66.75 in | Wt 191.8 lb

## 2017-11-26 DIAGNOSIS — Z0000 Encounter for general adult medical examination without abnormal findings: Secondary | ICD-10-CM

## 2017-11-26 DIAGNOSIS — N951 Menopausal and female climacteric states: Secondary | ICD-10-CM

## 2017-11-26 DIAGNOSIS — R232 Flushing: Secondary | ICD-10-CM | POA: Diagnosis not present

## 2017-11-26 DIAGNOSIS — Z30432 Encounter for removal of intrauterine contraceptive device: Secondary | ICD-10-CM | POA: Diagnosis not present

## 2017-11-26 LAB — TSH: TSH: 3.86 u[IU]/mL (ref 0.35–4.50)

## 2017-11-26 LAB — CBC WITH DIFFERENTIAL/PLATELET
BASOS ABS: 0 10*3/uL (ref 0.0–0.1)
Basophils Relative: 0.7 % (ref 0.0–3.0)
Eosinophils Absolute: 0.1 10*3/uL (ref 0.0–0.7)
Eosinophils Relative: 1.4 % (ref 0.0–5.0)
HCT: 37.1 % (ref 36.0–46.0)
Hemoglobin: 12.2 g/dL (ref 12.0–15.0)
LYMPHS ABS: 2.5 10*3/uL (ref 0.7–4.0)
Lymphocytes Relative: 49.3 % — ABNORMAL HIGH (ref 12.0–46.0)
MCHC: 32.9 g/dL (ref 30.0–36.0)
MCV: 84.1 fl (ref 78.0–100.0)
MONO ABS: 0.4 10*3/uL (ref 0.1–1.0)
MONOS PCT: 7.2 % (ref 3.0–12.0)
NEUTROS ABS: 2.1 10*3/uL (ref 1.4–7.7)
Neutrophils Relative %: 41.4 % — ABNORMAL LOW (ref 43.0–77.0)
PLATELETS: 135 10*3/uL — AB (ref 150.0–400.0)
RBC: 4.41 Mil/uL (ref 3.87–5.11)
RDW: 12.6 % (ref 11.5–15.5)
WBC: 5.1 10*3/uL (ref 4.0–10.5)

## 2017-11-26 LAB — COMPREHENSIVE METABOLIC PANEL
ALT: 18 U/L (ref 0–35)
AST: 19 U/L (ref 0–37)
Albumin: 4.1 g/dL (ref 3.5–5.2)
Alkaline Phosphatase: 69 U/L (ref 39–117)
BUN: 17 mg/dL (ref 6–23)
CO2: 30 meq/L (ref 19–32)
Calcium: 9.2 mg/dL (ref 8.4–10.5)
Chloride: 102 mEq/L (ref 96–112)
Creatinine, Ser: 0.66 mg/dL (ref 0.40–1.20)
GFR: 127.59 mL/min (ref >60.00)
GLUCOSE: 87 mg/dL (ref 70–99)
POTASSIUM: 4.2 meq/L (ref 3.5–5.1)
SODIUM: 138 meq/L (ref 135–145)
TOTAL PROTEIN: 7.8 g/dL (ref 6.0–8.3)
Total Bilirubin: 0.4 mg/dL (ref 0.2–1.2)

## 2017-11-26 LAB — LIPID PANEL
Cholesterol: 138 mg/dL (ref 0–200)
HDL: 57.4 mg/dL (ref >39.00)
LDL Cholesterol: 74 mg/dL (ref 0–99)
NONHDL: 80.81
Total CHOL/HDL Ratio: 2
Triglycerides: 36 mg/dL (ref 0.0–149.0)
VLDL: 7.2 mg/dL (ref 0.0–40.0)

## 2017-11-26 LAB — LUTEINIZING HORMONE: LH: 19.69 m[IU]/mL

## 2017-11-26 LAB — FOLLICLE STIMULATING HORMONE: FSH: 24.5 m[IU]/mL

## 2017-11-26 NOTE — Assessment & Plan Note (Signed)
IUD removed via ringed forceps. Pt tolerated procedure well- advised of possible spotting/uterine cramping. Also advised to start taking PNV. The patient indicates understanding of these issues and agrees with the plan.

## 2017-11-26 NOTE — Assessment & Plan Note (Signed)
Reviewed preventive care protocols, scheduled due services, and updated immunizations Discussed nutrition, exercise, diet, and healthy lifestyle.  Orders Placed This Encounter  Procedures  . CBC with Differential/Platelet  . Comprehensive metabolic panel  . Lipid panel  . TSH  . FSH  . LH

## 2017-11-26 NOTE — Patient Instructions (Signed)
Great to see you. I will call you with your lab results from today and you can view them online.  Happy birthday!!! 

## 2017-11-26 NOTE — Progress Notes (Signed)
Subjective:   Patient ID: Gabrielle Hill, female    DOB: July 11, 1975, 42 y.o.   MRN: [id number]  Gabrielle Hill is a pleasant 42 y.o. year old female who presents to clinic today with Annual Exam (Patient is here today for a CPE.  She did PAP in May at [facility] but they have not advised her of the results. She is not currently fasting.   Immunizations are UTD.)  on 11/26/2017  HPI:   Patient is here today for a CPE. She did PAP in May at [facility] but they have not advised her of the results. She is not currently fasting. Immunizations are UTD.  She would like her IUD removed today.  She and her husband are planning on getting pregnant with another child.  She has been having some hot flashes- wants her hormones checked today.  Current Outpatient Medications on File Prior to Visit  Medication Sig Dispense Refill  . albuterol (PROVENTIL HFA;VENTOLIN HFA) 108 (90 Base) MCG/ACT inhaler Inhale 1-2 puffs into the lungs every 6 (six) hours as needed. 1 Inhaler 0  . fluticasone (FLONASE) 50 MCG/ACT nasal spray Place 2 sprays into both nostrils daily. 16 g 0  . levonorgestrel (MIRENA) 20 MCG/24HR IUD 1 each by Intrauterine route once. Inserted 07/2016    . sodium chloride (OCEAN) 0.65 % SOLN nasal spray Place 1 spray into both nostrils as needed for congestion. 15 mL 0   No current facility-administered medications on file prior to visit.     No Known Allergies  Past Medical History:  Diagnosis Date  . Pregnancy induced hypertension    in first pregnancy 16 years ago    Past Surgical History:  Procedure Laterality Date  . DILATION AND EVACUATION N/A 05/23/2013   Procedure: DILATATION AND EVACUATION;  Surgeon: Zelphia Cairo, MD;  Location: [facility];  Service: Gynecology;  Laterality: N/A;    Family History  Problem Relation Age of Onset  . Sarcoidosis Mother        s/p lung transplant, post op diabetes  . Arthritis Father   . Dementia Father   . Drug  abuse Maternal Aunt   . Drug abuse Maternal Uncle   . Cancer Maternal Grandmother   . Cancer Paternal Grandmother   . Migraines Neg Hx     Social History   Socioeconomic History  . Marital status: Married    Spouse name: Not on file  . Number of children: 1  . Years of education: Not on file  . Highest education level: Not on file  Occupational History  . Occupation: Naval architect  . Financial resource strain: Not on file  . Food insecurity:    Worry: Not on file    Inability: Not on file  . Transportation needs:    Medical: Not on file    Non-medical: Not on file  Tobacco Use  . Smoking status: Never Smoker  . Smokeless tobacco: Never Used  Substance and Sexual Activity  . Alcohol use: Yes    Alcohol/week: 0.0 oz    Comment: Occasionally  . Drug use: No  . Sexual activity: Yes  Lifestyle  . Physical activity:    Days per week: Not on file    Minutes per session: Not on file  . Stress: Not on file  Relationships  . Social connections:    Talks on phone: Not on file    Gets together: Not on file    Attends religious service:  Not on file    Active member of club or organization: Not on file    Attends meetings of clubs or organizations: Not on file    Relationship status: Not on file  . Intimate partner violence:    Fear of current or ex partner: Not on file    Emotionally abused: Not on file    Physically abused: Not on file    Forced sexual activity: Not on file  Other Topics Concern  . Not on file  Social History Narrative   Married.   One daughter in college   Two small children- 52 year old boy and 48 month old girl   The PMH, PSH, Social History, Family History, Medications, and allergies have been reviewed in [facility], and have been updated if relevant.  Review of Systems  Constitutional: Negative.   HENT: Negative.   Eyes: Negative.   Respiratory: Negative.   Cardiovascular: Negative.   Gastrointestinal: Negative.   Endocrine: Positive  for heat intolerance.  Genitourinary: Negative.   Musculoskeletal: Negative.   Skin: Negative.   Allergic/Immunologic: Negative.   Neurological: Negative.   Hematological: Negative.   Psychiatric/Behavioral: Negative.   All other systems reviewed and are negative.      Objective:    BP 118/72 (BP Location: Left Arm, Patient Position: Sitting, Cuff Size: Normal)   Pulse 68   Temp 97.7 F (36.5 C) (Oral)   Ht 5' 6.75" (1.695 m)   Wt 191 lb 12.8 oz (87 kg)   SpO2 99%   BMI 30.27 kg/m    Physical Exam    General:  Well-developed,well-nourished,in no acute distress; alert,appropriate and cooperative throughout examination Head:  normocephalic and atraumatic.   Eyes:  vision grossly intact, PERRL Ears:  R ear normal and L ear normal externally, TMs clear bilaterally Nose:  no external deformity.   Mouth:  good dentition.   Neck:  No deformities, masses, or tenderness noted. Lungs:  Normal respiratory effort, chest expands symmetrically. Lungs are clear to auscultation, no crackles or wheezes. Heart:  Normal rate and regular rhythm. S1 and S2 normal without gallop, murmur, click, rub or other extra sounds. Abdomen:  Bowel sounds positive,abdomen soft and non-tender without masses, organomegaly or hernias noted. Rectal:  no external abnormalities.   Genitalia:  Pelvic Exam:        External: normal female genitalia without lesions or masses        Vagina: normal without lesions or masses        Cervix: normal without lesions or masses        Adnexa: normal bimanual exam without masses or fullness        Uterus: normal by palpation        IUD strings visible- IUD removed Msk:  No deformity or scoliosis noted of thoracic or lumbar spine.   Extremities:  No clubbing, cyanosis, edema, or deformity noted with normal full range of motion of all joints.   Neurologic:  alert & oriented X3 and gait normal.   Skin:  Intact without suspicious lesions or rashes Cervical Nodes:  No  lymphadenopathy noted Axillary Nodes:  No palpable lymphadenopathy Psych:  Cognition and judgment appear intact. Alert and cooperative with normal attention span and concentration. No apparent delusions, illusions, hallucinations      Assessment & Plan:   Well woman exam without gynecological exam - Plan: CBC with Differential/Platelet, Comprehensive metabolic panel, Lipid panel, TSH, FSH, LH, CANCELED: CBC with Differential/Platelet, CANCELED: Comprehensive metabolic panel, CANCELED: Lipid panel, CANCELED:  TSH  Hot flashes - Plan: CBC with Differential/Platelet, Comprehensive metabolic panel, Lipid panel, TSH, FSH, LH, CANCELED: LH, CANCELED: FSH  Encounter for IUD removal No follow-ups on file.

## 2017-11-26 NOTE — Assessment & Plan Note (Signed)
Intermittent for past few weeks- she would like her hormones and other blood work checked today.

## 2017-11-28 ENCOUNTER — Telehealth: Payer: Self-pay

## 2017-11-28 NOTE — Telephone Encounter (Signed)
Pt given results per notes of Dr.Aron on 11/27/17. Pt verbalized understanding.Pt states she not need to be mailed a copy of her lab work. Pt states she has been experiencing a lot of heavy cramping that seems to be constant but has not experienced any bleeding. Pt states she has been taking ibuprofen and aleve and it does help with cramping but when it wears off the pain returns.

## 2017-11-28 NOTE — Telephone Encounter (Signed)
Pt calling back to get lab results  

## 2017-11-28 NOTE — Telephone Encounter (Signed)
-----   Message from Dianne Dunalia M Aron, MD sent at 11/27/2017 10:53 AM EDT ----- Please call pt- her albs all look good.  Hormone levels really can't tell me too much since she is on mirena but thyroid function, cholesterol, blood sugar, liver function, kidney function look great. How is she feeling today?  Any cramping?

## 2017-11-28 NOTE — Progress Notes (Signed)
LMOVM for pt to RTC (created phone note as well)  PEC-Ok to reiterate results which look good/cannot really tell much from hormone levels due to the Mirena but thyroid function, cholesterol, blood sugar, liver function, and kidney function all look great  Dr. Dayton MartesAron wants to know how she is feeling today and if she is having any cramping/I asked that she call back to let me know and also if she wants her labs mailed to her/thx dmf

## 2017-11-28 NOTE — Telephone Encounter (Signed)
LMOVM for pt to RTC  PEC-Ok to reiterate results which look good/cannot really tell much from hormone levels due to the Mirena but thyroid function, cholesterol, blood sugar, liver function, and kidney function all look great  Dr. Dayton MartesAron wants to know how she is feeling today and if she is having any cramping/I asked that she call back to let me know and also if she wants her labs mailed to her/thx dmf

## 2017-11-29 NOTE — Addendum Note (Signed)
Addended by: Dianne DunARON, Vernessa Likes M on: 11/29/2017 03:55 PM   Modules accepted: Orders

## 2017-11-29 NOTE — Telephone Encounter (Signed)
Noted.  Thank you for the update.  Please keep us posted. Hopefully the cramping will subside soon.  Please mail her a copy of her results.

## 2017-11-30 ENCOUNTER — Encounter: Payer: Self-pay | Admitting: Family Medicine

## 2018-03-08 IMAGING — US US OB TRANSVAGINAL
1 series · 15 of 28 positions shown · non-contrast
Comparison: None.

CLINICAL DATA: Vaginal bleeding

EXAM:
OBSTETRIC <14 WK US AND TRANSVAGINAL OB US
TECHNIQUE: Both transabdominal and transvaginal ultrasound examinations were
performed for complete evaluation of the gestation as well as the
maternal uterus, adnexal regions, and pelvic cul-de-sac.
Transvaginal technique was performed to assess early pregnancy.

[Series 1: us ob transvaginal · 15 of 55 slices shown]
[im 1/55]
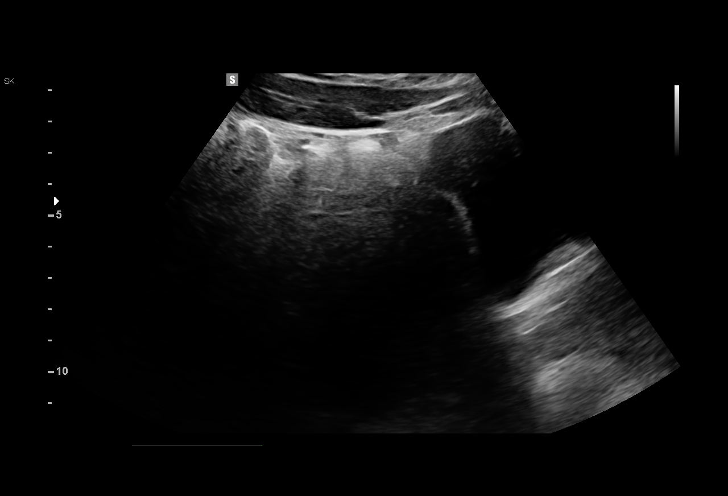
[im 5/55]
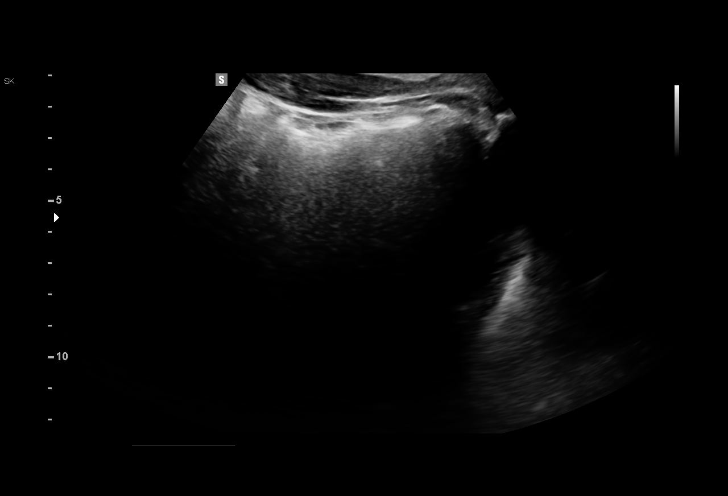
[im 9/55]
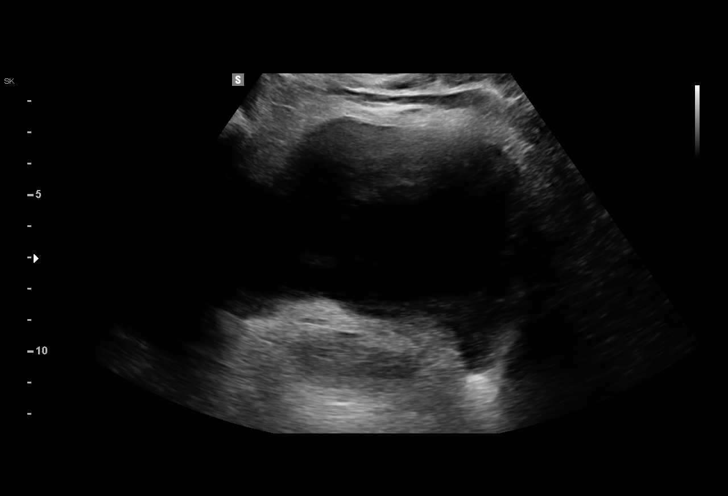
[im 13/55]
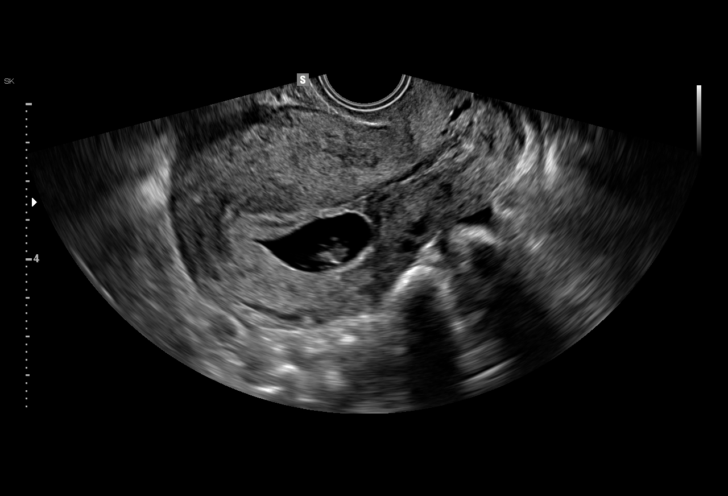
[im 17/55]
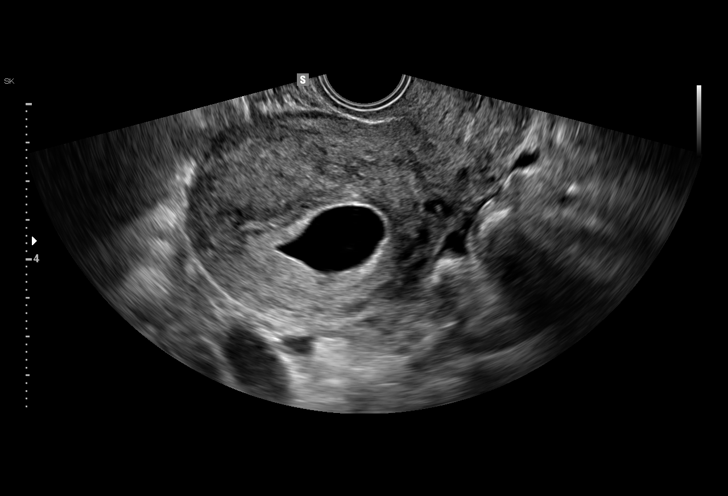
[im 21/55]
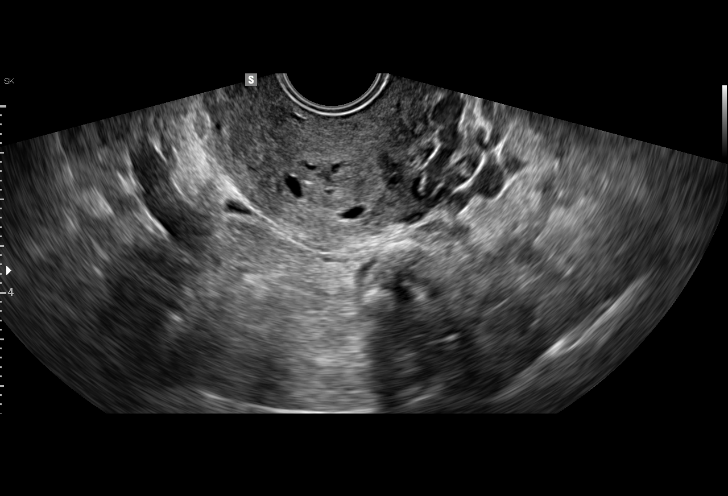
[im 25/55]
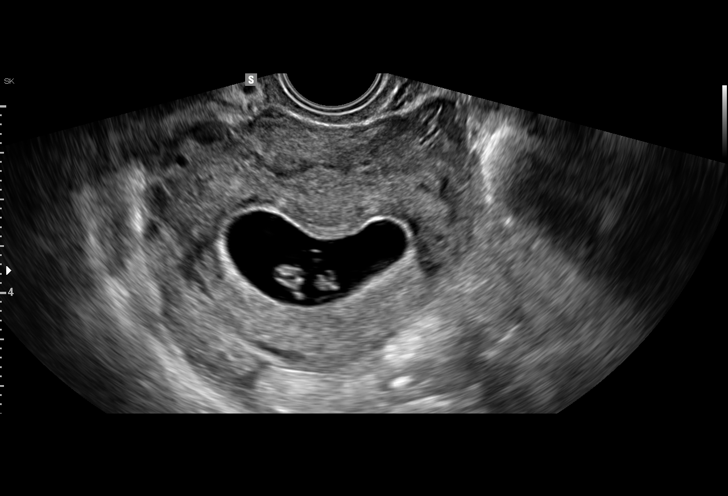
[im 29/55]
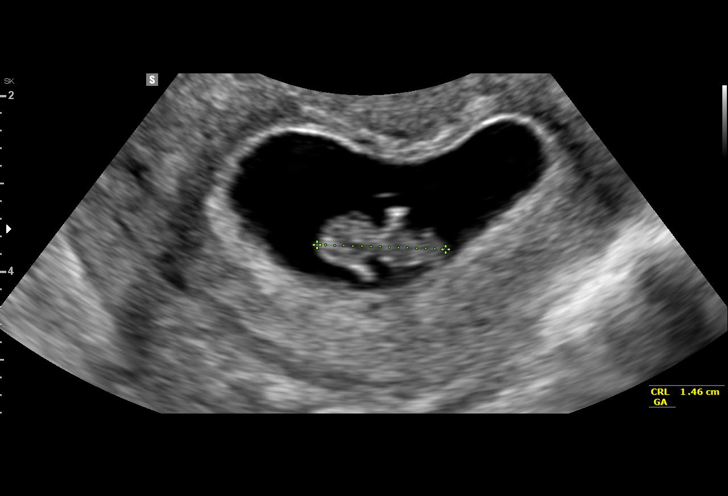
[im 31/55]
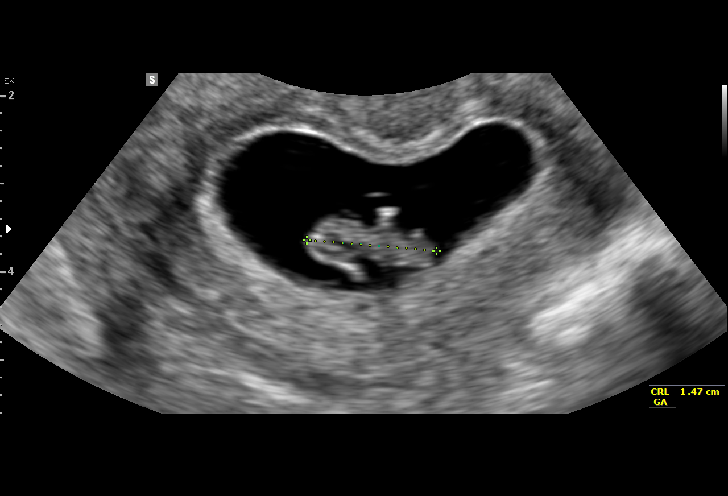
[im 35/55]
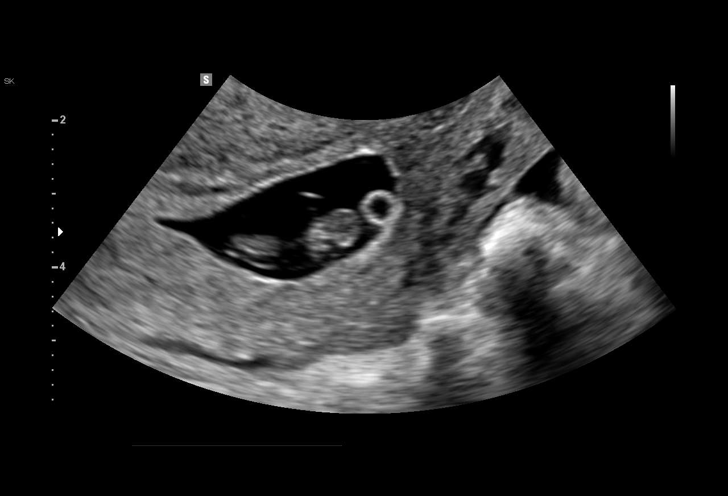
[im 39/55]
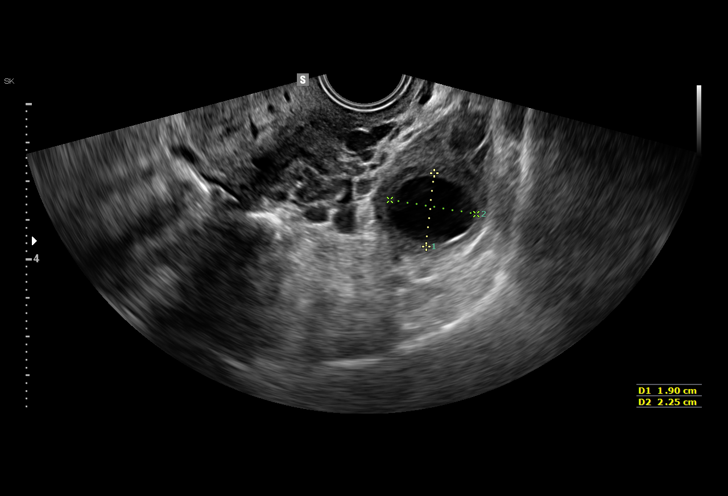
[im 43/55]
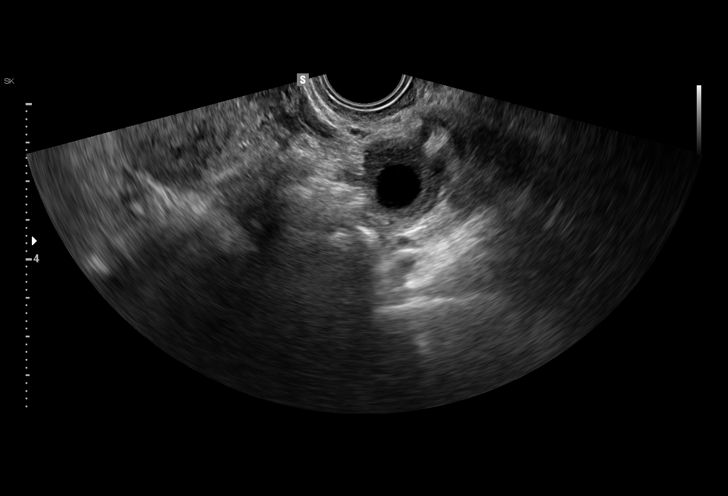
[im 47/55]
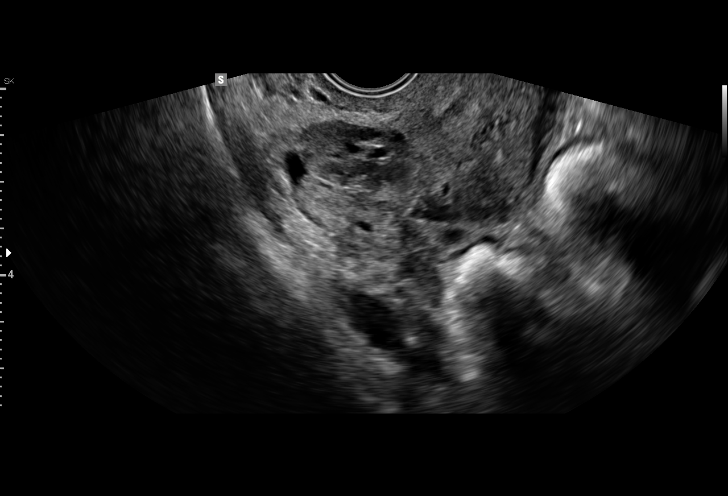
[im 51/55]
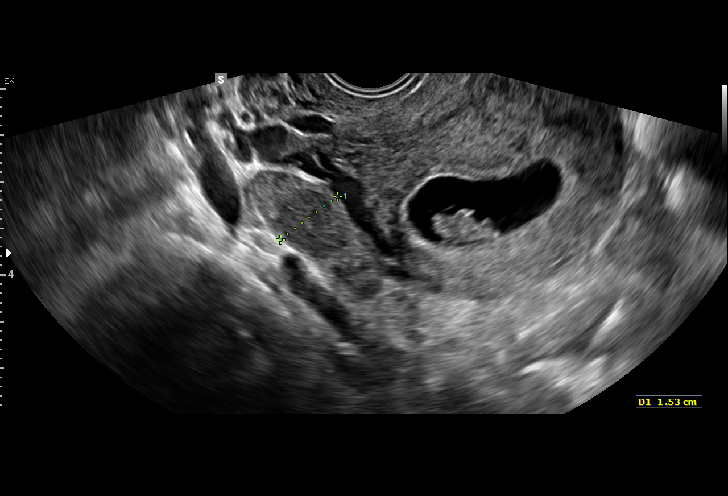
[im 55/55]
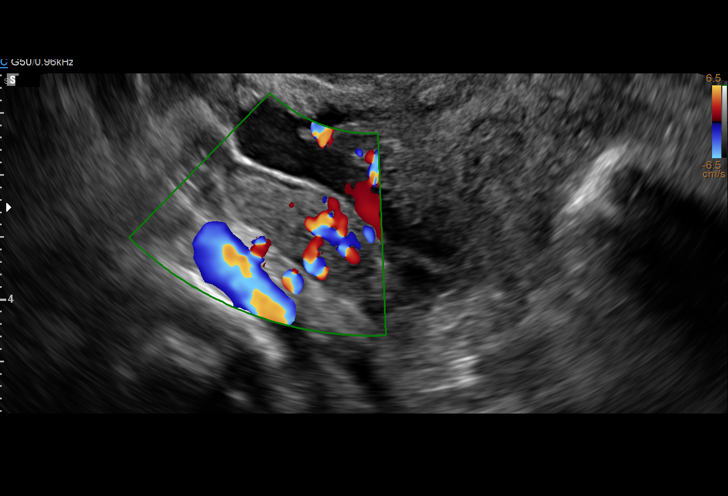

[15 of 28 positions shown; findings below may reference images not displayed]

FINDINGS: Intrauterine gestational sac: Single

Yolk sac:  Yes

Embryo:  Yes

Cardiac Activity: Yes

Heart Rate: 152  bpm

MSD:   mm    w     d

CRL:  14.9  mm   7 w   6 d                  US EDC: 05/07/2016

Subchorionic hemorrhage:  None visualized.

Maternal uterus/adnexae: Normal ovaries.  No free pelvic fluid.
IMPRESSION: Single living intrauterine gestation measuring 7 weeks 6 days by
crown-rump length.

## 2018-03-27 ENCOUNTER — Encounter: Payer: Self-pay | Admitting: Family Medicine

## 2018-03-27 ENCOUNTER — Ambulatory Visit (INDEPENDENT_AMBULATORY_CARE_PROVIDER_SITE_OTHER): Payer: BLUE CROSS/BLUE SHIELD | Admitting: Family Medicine

## 2018-03-27 VITALS — BP 124/82 | HR 71 | Temp 98.5°F | Ht 66.75 in | Wt 210.0 lb

## 2018-03-27 DIAGNOSIS — Z01818 Encounter for other preprocedural examination: Secondary | ICD-10-CM | POA: Diagnosis not present

## 2018-03-27 DIAGNOSIS — Z23 Encounter for immunization: Secondary | ICD-10-CM | POA: Diagnosis not present

## 2018-03-27 DIAGNOSIS — O139 Gestational [pregnancy-induced] hypertension without significant proteinuria, unspecified trimester: Secondary | ICD-10-CM | POA: Insufficient documentation

## 2018-03-27 NOTE — Progress Notes (Signed)
Subjective:    Gabrielle Hill is a 42 y.o. female who presents to the office today for a preoperative consultation at the request of surgeon Dr. Despina Hick who plans on performing a TRH replacement in two weeks. Planned anesthesia: general. The patient has the following known anesthesia issues: none. Patients bleeding risk: no recent abnormal bleeding.  Current Outpatient Medications on File Prior to Visit  Medication Sig Dispense Refill  . albuterol (PROVENTIL HFA;VENTOLIN HFA) 108 (90 Base) MCG/ACT inhaler Inhale 1-2 puffs into the lungs every 6 (six) hours as needed. (Patient not taking: Reported on 03/27/2018) 1 Inhaler 0  . fluticasone (FLONASE) 50 MCG/ACT nasal spray Place 2 sprays into both nostrils daily. (Patient not taking: Reported on 03/27/2018) 16 g 0  . sodium chloride (OCEAN) 0.65 % SOLN nasal spray Place 1 spray into both nostrils as needed for congestion. (Patient not taking: Reported on 03/27/2018) 15 mL 0   No current facility-administered medications on file prior to visit.     No Known Allergies  Past Medical History:  Diagnosis Date  . Pregnancy induced hypertension    in first pregnancy 16 years ago    Past Surgical History:  Procedure Laterality Date  . DILATION AND EVACUATION N/A 05/23/2013   Procedure: DILATATION AND EVACUATION;  Surgeon: Zelphia Cairo, MD;  Location: [facility];  Service: Gynecology;  Laterality: N/A;    Family History  Problem Relation Age of Onset  . Sarcoidosis Mother        s/p lung transplant, post op diabetes  . Arthritis Father   . Dementia Father   . Drug abuse Maternal Aunt   . Drug abuse Maternal Uncle   . Cancer Maternal Grandmother   . Cancer Paternal Grandmother   . Migraines Neg Hx     Social History   Socioeconomic History  . Marital status: Married    Spouse name: Not on file  . Number of children: 1  . Years of education: Not on file  . Highest education level: Not on file  Occupational History  . Occupation:  Naval architect  . Financial resource strain: Not on file  . Food insecurity:    Worry: Not on file    Inability: Not on file  . Transportation needs:    Medical: Not on file    Non-medical: Not on file  Tobacco Use  . Smoking status: Never Smoker  . Smokeless tobacco: Never Used  Substance and Sexual Activity  . Alcohol use: Yes    Alcohol/week: 0.0 standard drinks    Comment: Occasionally  . Drug use: No  . Sexual activity: Yes  Lifestyle  . Physical activity:    Days per week: Not on file    Minutes per session: Not on file  . Stress: Not on file  Relationships  . Social connections:    Talks on phone: Not on file    Gets together: Not on file    Attends religious service: Not on file    Active member of club or organization: Not on file    Attends meetings of clubs or organizations: Not on file    Relationship status: Not on file  . Intimate partner violence:    Fear of current or ex partner: Not on file    Emotionally abused: Not on file    Physically abused: Not on file    Forced sexual activity: Not on file  Other Topics Concern  . Not on file  Social History Narrative  Married.   One daughter in college   Two small children- 57 year old boy and 68 month old girl   The PMH, PSH, Social History, Family History, Medications, and allergies have been reviewed in [facility], and have been updated if relevant.   Current Outpatient Medications on File Prior to Visit  Medication Sig Dispense Refill  . albuterol (PROVENTIL HFA;VENTOLIN HFA) 108 (90 Base) MCG/ACT inhaler Inhale 1-2 puffs into the lungs every 6 (six) hours as needed. (Patient not taking: Reported on 03/27/2018) 1 Inhaler 0  . fluticasone (FLONASE) 50 MCG/ACT nasal spray Place 2 sprays into both nostrils daily. (Patient not taking: Reported on 03/27/2018) 16 g 0  . sodium chloride (OCEAN) 0.65 % SOLN nasal spray Place 1 spray into both nostrils as needed for congestion. (Patient not taking: Reported on  03/27/2018) 15 mL 0   No current facility-administered medications on file prior to visit.     No Known Allergies  Past Medical History:  Diagnosis Date  . Pregnancy induced hypertension    in first pregnancy 16 years ago    Past Surgical History:  Procedure Laterality Date  . DILATION AND EVACUATION N/A 05/23/2013   Procedure: DILATATION AND EVACUATION;  Surgeon: Zelphia Cairo, MD;  Location: [facility];  Service: Gynecology;  Laterality: N/A;    Family History  Problem Relation Age of Onset  . Sarcoidosis Mother        s/p lung transplant, post op diabetes  . Arthritis Father   . Dementia Father   . Drug abuse Maternal Aunt   . Drug abuse Maternal Uncle   . Cancer Maternal Grandmother   . Cancer Paternal Grandmother   . Migraines Neg Hx     Social History   Socioeconomic History  . Marital status: Married    Spouse name: Not on file  . Number of children: 1  . Years of education: Not on file  . Highest education level: Not on file  Occupational History  . Occupation: Naval architect  . Financial resource strain: Not on file  . Food insecurity:    Worry: Not on file    Inability: Not on file  . Transportation needs:    Medical: Not on file    Non-medical: Not on file  Tobacco Use  . Smoking status: Never Smoker  . Smokeless tobacco: Never Used  Substance and Sexual Activity  . Alcohol use: Yes    Alcohol/week: 0.0 standard drinks    Comment: Occasionally  . Drug use: No  . Sexual activity: Yes  Lifestyle  . Physical activity:    Days per week: Not on file    Minutes per session: Not on file  . Stress: Not on file  Relationships  . Social connections:    Talks on phone: Not on file    Gets together: Not on file    Attends religious service: Not on file    Active member of club or organization: Not on file    Attends meetings of clubs or organizations: Not on file    Relationship status: Not on file  . Intimate partner violence:    Fear of  current or ex partner: Not on file    Emotionally abused: Not on file    Physically abused: Not on file    Forced sexual activity: Not on file  Other Topics Concern  . Not on file  Social History Narrative   Married.   One daughter in college   Two small  children- 42 year old boy and 69 month old girl   The PMH, PSH, Social History, Family History, Medications, and allergies have been reviewed in [facility], and have been updated if relevant.  Review of Systems Pertinent items are noted in HPI.    Objective:    BP 124/82 (BP Location: Left Arm, Patient Position: Sitting, Cuff Size: Normal)   Pulse 71   Temp 98.5 F (36.9 C) (Oral)   Ht 5' 6.75" (1.695 m)   Wt 210 lb (95.3 kg)   LMP 03/21/2018   SpO2 99%   BMI 33.14 kg/m   General Appearance:    Alert, cooperative, no distress, appears stated age  Head:    Normocephalic, without obvious abnormality, atraumatic  Eyes:    PERRL, conjunctiva/corneas clear, EOM's intact, fundi    benign, both eyes  Ears:    Normal TM's and external ear canals, both ears  Nose:   Nares normal, septum midline, mucosa normal, no drainage    or sinus tenderness  Throat:   Lips, mucosa, and tongue normal; teeth and gums normal  Neck:   Supple, symmetrical, trachea midline, no adenopathy;    thyroid:  no enlargement/tenderness/nodules; no carotid   bruit or JVD  Back:     Symmetric, no curvature, ROM normal, no CVA tenderness  Lungs:     Clear to auscultation bilaterally, respirations unlabored  Chest Wall:    No tenderness or deformity   Heart:    Regular rate and rhythm, S1 and S2 normal, no murmur, rub   or gallop  Abdomen:     Soft, non-tender, bowel sounds active all four quadrants,    no masses, no organomegaly  Genitalia:    Normal female without lesion, discharge or tenderness  Rectal:    Normal tone, normal prostate, no masses or tenderness;   guaiac negative stool  Extremities:   Extremities normal, atraumatic, no cyanosis or edema  Pulses:    2+ and symmetric all extremities  Skin:   Skin color, texture, turgor normal, no rashes or lesions  Lymph nodes:   Cervical, supraclavicular, and axillary nodes normal  Neurologic:   CNII-XII intact, normal strength, sensation and reflexes    throughout    Cardiographics ECG: sinus rhythm with borderline short PR- relatively unchanged from EKG dated 03/21/11     Lab Review  No visits with results within 2 Month(s) from this visit.  Latest known visit with results is:  Office Visit on 11/26/2017  Component Date Value  . WBC 11/26/2017 5.1   . RBC 11/26/2017 4.41   . Hemoglobin 11/26/2017 12.2   . HCT 11/26/2017 37.1   . MCV 11/26/2017 84.1   . MCHC 11/26/2017 32.9   . RDW 11/26/2017 12.6   . Platelets 11/26/2017 135.0*  . Neutrophils Relative % 11/26/2017 41.4*  . Lymphocytes Relative 11/26/2017 49.3*  . Monocytes Relative 11/26/2017 7.2   . Eosinophils Relative 11/26/2017 1.4   . Basophils Relative 11/26/2017 0.7   . Neutro Abs 11/26/2017 2.1   . Lymphs Abs 11/26/2017 2.5   . Monocytes Absolute 11/26/2017 0.4   . Eosinophils Absolute 11/26/2017 0.1   . Basophils Absolute 11/26/2017 0.0   . Sodium 11/26/2017 138   . Potassium 11/26/2017 4.2   . Chloride 11/26/2017 102   . CO2 11/26/2017 30   . Glucose, Bld 11/26/2017 87   . BUN 11/26/2017 17   . Creatinine, Ser 11/26/2017 0.66   . Total Bilirubin 11/26/2017 0.4   . Alkaline Phosphatase 11/26/2017  69   . AST 11/26/2017 19   . ALT 11/26/2017 18   . Total Protein 11/26/2017 7.8   . Albumin 11/26/2017 4.1   . Calcium 11/26/2017 9.2   . GFR 11/26/2017 127.59   . Cholesterol 11/26/2017 138   . Triglycerides 11/26/2017 36.0   . HDL 11/26/2017 57.40   . VLDL 11/26/2017 7.2   . LDL Cholesterol 11/26/2017 74   . Total CHOL/HDL Ratio 11/26/2017 2   . NonHDL 11/26/2017 80.81   . TSH 11/26/2017 3.86   . [facility] 11/26/2017 24.5   . [facility] 11/26/2017 19.69       Assessment:      42 y.o. female with planned surgery as above.     EKG is unchanged from 02/2011. Mild thrombocytopenia - unchanged from 04/2016.  Cardiac Risk Estimation: low    Plan:  Medically cleared for surgery. EKG is unchanged from 02/2011. Mild thrombocytopenia - unchanged from 04/2016.

## 2018-07-03 ENCOUNTER — Ambulatory Visit: Payer: BLUE CROSS/BLUE SHIELD | Admitting: Family Medicine

## 2018-07-09 ENCOUNTER — Ambulatory Visit (INDEPENDENT_AMBULATORY_CARE_PROVIDER_SITE_OTHER): Payer: BLUE CROSS/BLUE SHIELD | Admitting: Family Medicine

## 2018-07-09 ENCOUNTER — Encounter: Payer: Self-pay | Admitting: Family Medicine

## 2018-07-09 VITALS — BP 128/70 | HR 84 | Temp 98.4°F | Ht 66.75 in | Wt 214.6 lb

## 2018-07-09 DIAGNOSIS — Z319 Encounter for procreative management, unspecified: Secondary | ICD-10-CM

## 2018-07-09 DIAGNOSIS — N912 Amenorrhea, unspecified: Secondary | ICD-10-CM | POA: Diagnosis not present

## 2018-07-09 LAB — HCG, QUANTITATIVE, PREGNANCY: QUANTITATIVE HCG: 0.7 m[IU]/mL

## 2018-07-09 LAB — POCT URINE PREGNANCY: PREG TEST UR: NEGATIVE

## 2018-07-09 LAB — TSH: TSH: 3.2 u[IU]/mL (ref 0.35–4.50)

## 2018-07-09 MED ORDER — PRENATAL MULTI +DHA 27-0.8-200 MG PO CAPS: 1.0000 | ORAL_CAPSULE | Freq: Every day | ORAL | 11 refills | Status: AC

## 2018-07-09 MED ORDER — PRENATAL MULTI +DHA 27-0.8-200 MG PO CAPS: 1.0000 | ORAL_CAPSULE | Freq: Every day | ORAL | 11 refills | Status: DC

## 2018-07-09 NOTE — Progress Notes (Signed)
Subjective:   Patient ID: Gabrielle Hill, female    DOB: 1975-12-10, 43 y.o.   MRN: [id number]  Gabrielle Hill is a pleasant 43 y.o. year old female who presents to clinic today with Contraception (Patient is here today to discuss family palnning issues.  She is unsure if she is currently pregnant but as of last week home HCG negative. Have had period Sx with breast tenderness etc.  Took a test on 1.30.20.  Went to [employer] and is wondering if she is pregnant or a thyroid issue. She is normally really regular. Has normal cramping. )  on 07/09/2018  HPI:  We removed her IUD on 11/26/17.  Has been regular since it was removed.   She has been wanting to get pregnant after her hip surgery.   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  She is taking a daily PNV.  Unusre if she is pregnant.  LMP 05/20/18.  She is having breast tenderness. NO spotting.  HPT was negative last week.  Lab Results  Component Value Date   TSH 3.86 11/26/2017   No current outpatient medications on file prior to visit.   No current facility-administered medications on file prior to visit.     No Known Allergies  Past Medical History:  Diagnosis Date  . Pregnancy induced hypertension    in first pregnancy 16 years ago    Past Surgical History:  Procedure Laterality Date  . DILATION AND EVACUATION N/A 05/23/2013   Procedure: DILATATION AND EVACUATION;  Surgeon: Zelphia Cairo, MD;  Location: [facility];  Service: Gynecology;  Laterality: N/A;    Family History  Problem Relation Age of Onset  . Sarcoidosis Mother        s/p lung transplant, post op diabetes  . Arthritis Father   . Dementia Father   . Drug abuse Maternal Aunt   . Drug abuse Maternal Uncle   . Cancer Maternal Grandmother   . Cancer Paternal Grandmother   . Migraines Neg Hx     Social History   Socioeconomic History  . Marital status: Married    Spouse name: Not on file  . Number of children: 1  . Years of education: Not on file    . Highest education level: Not on file  Occupational History  . Occupation: Naval architect  . Financial resource strain: Not on file  . Food insecurity:    Worry: Not on file    Inability: Not on file  . Transportation needs:    Medical: Not on file    Non-medical: Not on file  Tobacco Use  . Smoking status: Never Smoker  . Smokeless tobacco: Never Used  Substance and Sexual Activity  . Alcohol use: Yes    Alcohol/week: 0.0 standard drinks    Comment: Occasionally  . Drug use: No  . Sexual activity: Yes  Lifestyle  . Physical activity:    Days per week: Not on file    Minutes per session: Not on file  . Stress: Not on file  Relationships  . Social connections:    Talks on phone: Not on file    Gets together: Not on file    Attends religious service: Not on file    Active member of club or organization: Not on file    Attends meetings of clubs or organizations: Not on file    Relationship status: Not on file  . Intimate partner violence:    Fear of current or  ex partner: Not on file    Emotionally abused: Not on file    Physically abused: Not on file    Forced sexual activity: Not on file  Other Topics Concern  . Not on file  Social History Narrative   Married.   One daughter in college   Two small children- 41 year old boy and 41 month old girl   The PMH, PSH, Social History, Family History, Medications, and allergies have been reviewed in [facility], and have been updated if relevant.    Review of Systems  Genitourinary: Positive for menstrual problem. Negative for decreased urine volume, difficulty urinating, dyspareunia, dysuria, enuresis, flank pain, frequency, genital sores, hematuria, pelvic pain, urgency, vaginal bleeding, vaginal discharge and vaginal pain.  All other systems reviewed and are negative.      Objective:    BP 128/70 (BP Location: Left Arm, Patient Position: Sitting, Cuff Size: Normal)   Pulse 84   Temp 98.4 F (36.9 C) (Oral)    Ht 5' 6.75" (1.695 m)   Wt 214 lb 9.6 oz (97.3 kg)   LMP 05/20/2018   SpO2 98%   BMI 33.86 kg/m    Physical Exam Vitals signs and nursing note reviewed.  Constitutional:      General: She is not in acute distress.    Appearance: Normal appearance. She is normal weight. She is not ill-appearing.  HENT:     Head: Normocephalic and atraumatic.     Nose: Nose normal.     Mouth/Throat:     Mouth: Mucous membranes are moist.  Eyes:     Extraocular Movements: Extraocular movements intact.  Neck:     Musculoskeletal: Normal range of motion.  Cardiovascular:     Rate and Rhythm: Normal rate.  Pulmonary:     Effort: Pulmonary effort is normal.  Musculoskeletal: Normal range of motion.  Skin:    General: Skin is warm and dry.  Neurological:     General: No focal deficit present.     Mental Status: She is alert and oriented to person, place, and time.  Psychiatric:        Mood and Affect: Mood normal.        Behavior: Behavior normal.        Thought Content: Thought content normal.        Judgment: Judgment normal.           Assessment & Plan:   Patient desires pregnancy - Plan: B-HCG Quant, Progesterone, LH, FSH, TSH, POCT urine pregnancy  Amenorrhea - Plan: POCT urine pregnancy No follow-ups on file.

## 2018-07-09 NOTE — Assessment & Plan Note (Signed)
With amenorrhea. U preg negative today. Will check bhcg quant along with progesterone, thyroid function, LH and FSH. I did counsel her on starting a PNV daily, even before she gets pregnant.  eRx sent for previous PNV that she was taking. The patient indicates understanding of these issues and agrees with the plan. Orders Placed This Encounter  Procedures  . B-HCG Quant  . Progesterone  . LH  . FSH  . TSH  . POCT urine pregnancy

## 2018-07-09 NOTE — Patient Instructions (Addendum)
Great to see you.  Please start a prenatal vitamin.  I will call you tomorrow with your lab results.

## 2018-07-10 ENCOUNTER — Telehealth: Payer: Self-pay

## 2018-07-10 LAB — LUTEINIZING HORMONE: LH: 13.1 m[IU]/mL

## 2018-07-10 LAB — FOLLICLE STIMULATING HORMONE: FSH: 11.1 m[IU]/mL

## 2018-07-10 LAB — PROGESTERONE: PROGESTERONE: 1.2 ng/mL

## 2018-07-10 NOTE — Telephone Encounter (Signed)
Pt aware of Dr. Hulen Shouts recommendation to get ovulation kit and best time for checking, to contact her GYN Dr. Charlesetta Garibaldi and I would be faxing over results as they may decide to do something else or in addition/thx dmf

## 2018-07-10 NOTE — Telephone Encounter (Signed)
-----   Message from Dianne Dun, MD sent at 07/10/2018 11:28 AM EST ----- Please see what I have already written below- progesterone level is now back and it is low.  Typically this indicates that she is not currently ovulating and it is best to check it around day 21 of a 28 day cycle.  Since we don't know what day she is on actually as she skipped a period in January, it is difficult to say what this low level really means.    I would recommend that she follow up with her gynecologist (please fax results to her gynecologist).  They may decide to supplement progesterone at some point if it remains low or do additional labs if she is having difficulty conceiving.

## 2018-07-10 NOTE — Telephone Encounter (Signed)
Yes okay to change if patient agrees.

## 2018-07-10 NOTE — Telephone Encounter (Signed)
TA-Pt ins company will not pay for the current prenatal vitamin/Pharmacy states that they will pay for  Prenate DHA Softgel 28,1,300 Is it ok if I change it and send it in to [facility]? Plz advise/thx dmf

## 2018-12-26 LAB — OB RESULTS CONSOLE ABO/RH: RH Type: POSITIVE

## 2018-12-26 LAB — OB RESULTS CONSOLE RPR: RPR: NONREACTIVE

## 2018-12-26 LAB — OB RESULTS CONSOLE HIV ANTIBODY (ROUTINE TESTING): HIV: NONREACTIVE

## 2018-12-26 LAB — OB RESULTS CONSOLE ANTIBODY SCREEN: Antibody Screen: NEGATIVE

## 2018-12-26 LAB — OB RESULTS CONSOLE RUBELLA ANTIBODY, IGM: Rubella: IMMUNE

## 2018-12-26 LAB — OB RESULTS CONSOLE GC/CHLAMYDIA
Chlamydia: NEGATIVE
Gonorrhea: NEGATIVE

## 2018-12-26 LAB — OB RESULTS CONSOLE HEPATITIS B SURFACE ANTIGEN: Hepatitis B Surface Ag: NEGATIVE

## 2019-05-23 NOTE — L&D Delivery Note (Signed)
Delivery Note   Patient Name: Gabrielle Hill DOB: 30-Sep-1975 MRN: [id number]  Date of admission: 07/09/2019 Delivering MD: Dale Luzerne  Date of delivery: 07/09/19 Type of delivery: SVD  Newborn Data: Live born female  Birth Weight:   APGAR: 8, 9  Newborn Delivery   Birth date/time: 07/09/2019 16:03:00 Delivery type: Vaginal, Spontaneous     Gabrielle Hill, 43 y.o., @ [redacted]w[redacted]d,  [id number], who was admitted for PROM in early labor. I was called to the room when she progressed +2 station in the second stage of labor.  She pushed for 15/min.  She delivered a viable infant, cephalic and restituted to the LOT position over an intact perineum.  A nuchal cord   was not identified. The baby was placed on maternal abdomen while initial step of NRP were perfmored (Dry, Stimulated, and warmed). Hat placed on baby for thermoregulation. Delayed cord clamping was performed for 1 minutes.  Cord double clamped and cut.  Cord cut by father. Apgar scores were 8 and 9. Prophylactic Pitocin was started in the third stage of labor for active management. The placenta delivered spontaneously, shultz, with a 3 vessel cord and was sent to LD.  Inspection revealed small  Perineal abrasion not repaired, hemostatic. An examination of the vaginal vault and cervix was free from lacerations. Low hanging cervix slight protruding with fundal rub. The uterus was firm, bleeding stable. Placenta and umbilical artery blood gas were not sent.  There were no complications during the procedure.  Mom and baby skin to skin following delivery. Left in stable condition. Pt denies HA, RUQ pain or vision changes. PCR for elevated BP but no dx, did not meet criteria of 4 hours between elevated BP, PCR was 0.23, all other labs unremarkable.   Maternal Info: Anesthesia: Epidural Episiotomy: no Lacerations:  perineal abrasion no repair, hemostatic Suture Repair: none Est. Blood Loss (mL):  Pt for PP tubal on 2/18 @ 1030, will be NPO  after midnight.   Newborn Info:  Baby Sex: female Circumcision: In pt desired APGAR (1 MIN): 8   APGAR (5 MINS): 9   APGAR (10 MINS):     Mom to postpartum.  Baby to Couplet care / Skin to Skin.  Dr Estanislado Pandy aware of birth.    [place], [place], NP-C 07/09/19 4:51 PM

## 2019-06-24 LAB — OB RESULTS CONSOLE GBS: GBS: NEGATIVE

## 2019-07-07 ENCOUNTER — Encounter (HOSPITAL_COMMUNITY): Payer: Self-pay | Admitting: *Deleted

## 2019-07-07 ENCOUNTER — Telehealth (HOSPITAL_COMMUNITY): Payer: Self-pay | Admitting: *Deleted

## 2019-07-07 NOTE — Telephone Encounter (Signed)
Preadmission screen  

## 2019-07-08 ENCOUNTER — Encounter (HOSPITAL_COMMUNITY): Payer: Self-pay | Admitting: *Deleted

## 2019-07-09 ENCOUNTER — Inpatient Hospital Stay (HOSPITAL_COMMUNITY): Payer: BC Managed Care – PPO | Admitting: Anesthesiology

## 2019-07-09 ENCOUNTER — Encounter (HOSPITAL_COMMUNITY): Payer: Self-pay | Admitting: Obstetrics and Gynecology

## 2019-07-09 ENCOUNTER — Other Ambulatory Visit: Payer: Self-pay

## 2019-07-09 ENCOUNTER — Inpatient Hospital Stay (HOSPITAL_COMMUNITY)
Admission: AD | Admit: 2019-07-09 | Discharge: 2019-07-11 | DRG: 798 | Disposition: A | Discharge status: Home / Self Care | Payer: BC Managed Care – PPO | Attending: Obstetrics and Gynecology | Admitting: Obstetrics and Gynecology

## 2019-07-09 DIAGNOSIS — Z3A38 38 weeks gestation of pregnancy: Secondary | ICD-10-CM | POA: Diagnosis not present

## 2019-07-09 DIAGNOSIS — O99214 Obesity complicating childbirth: Secondary | ICD-10-CM | POA: Diagnosis present

## 2019-07-09 DIAGNOSIS — O9902 Anemia complicating childbirth: Secondary | ICD-10-CM | POA: Diagnosis present

## 2019-07-09 DIAGNOSIS — Z20822 Contact with and (suspected) exposure to covid-19: Secondary | ICD-10-CM | POA: Diagnosis present

## 2019-07-09 DIAGNOSIS — O4292 Full-term premature rupture of membranes, unspecified as to length of time between rupture and onset of labor: Principal | ICD-10-CM | POA: Diagnosis present

## 2019-07-09 DIAGNOSIS — Z302 Encounter for sterilization: Secondary | ICD-10-CM | POA: Diagnosis not present

## 2019-07-09 DIAGNOSIS — O26893 Other specified pregnancy related conditions, third trimester: Secondary | ICD-10-CM | POA: Diagnosis present

## 2019-07-09 DIAGNOSIS — D649 Anemia, unspecified: Secondary | ICD-10-CM | POA: Diagnosis present

## 2019-07-09 DIAGNOSIS — R030 Elevated blood-pressure reading, without diagnosis of hypertension: Secondary | ICD-10-CM | POA: Diagnosis present

## 2019-07-09 DIAGNOSIS — O4290 Premature rupture of membranes, unspecified as to length of time between rupture and onset of labor, unspecified weeks of gestation: Secondary | ICD-10-CM | POA: Diagnosis present

## 2019-07-09 LAB — URINALYSIS, ROUTINE W REFLEX MICROSCOPIC
Bilirubin Urine: NEGATIVE
Glucose, UA: NEGATIVE mg/dL
Hgb urine dipstick: NEGATIVE
Ketones, ur: NEGATIVE mg/dL
Leukocytes,Ua: NEGATIVE
Nitrite: NEGATIVE
Protein, ur: NEGATIVE mg/dL
Specific Gravity, Urine: 1.014 (ref 1.005–1.030)
pH: 7 (ref 5.0–8.0)

## 2019-07-09 LAB — COMPREHENSIVE METABOLIC PANEL
ALT: 18 U/L (ref 0–44)
AST: 23 U/L (ref 15–41)
Albumin: 2.7 g/dL — ABNORMAL LOW (ref 3.5–5.0)
Alkaline Phosphatase: 165 U/L — ABNORMAL HIGH (ref 38–126)
Anion gap: 10 (ref 5–15)
BUN: 5 mg/dL — ABNORMAL LOW (ref 6–20)
CO2: 22 mmol/L (ref 22–32)
Calcium: 8.8 mg/dL — ABNORMAL LOW (ref 8.9–10.3)
Chloride: 104 mmol/L (ref 98–111)
Creatinine, Ser: 0.59 mg/dL (ref 0.44–1.00)
GFR calc Af Amer: >60 mL/min (ref >60)
GFR calc non Af Amer: >60 mL/min (ref >60)
Glucose, Bld: 86 mg/dL (ref 70–99)
Potassium: 4.1 mmol/L (ref 3.5–5.1)
Sodium: 136 mmol/L (ref 135–145)
Total Bilirubin: 0.6 mg/dL (ref 0.3–1.2)
Total Protein: 6.9 g/dL (ref 6.5–8.1)

## 2019-07-09 LAB — PROTEIN / CREATININE RATIO, URINE
Creatinine, Urine: 115.65 mg/dL
Protein Creatinine Ratio: 0.23 mg/mg{Cre} — ABNORMAL HIGH (ref 0.00–0.15)
Total Protein, Urine: 27 mg/dL

## 2019-07-09 LAB — TYPE AND SCREEN
ABO/RH(D): B POS
Antibody Screen: NEGATIVE

## 2019-07-09 LAB — RESPIRATORY PANEL BY RT PCR (FLU A&B, COVID)
Influenza A by PCR: NEGATIVE
Influenza B by PCR: NEGATIVE
SARS Coronavirus 2 by RT PCR: NEGATIVE

## 2019-07-09 LAB — CBC
HCT: 35 % — ABNORMAL LOW (ref 36.0–46.0)
Hemoglobin: 11 g/dL — ABNORMAL LOW (ref 12.0–15.0)
MCH: 27.8 pg (ref 26.0–34.0)
MCHC: 31.4 g/dL (ref 30.0–36.0)
MCV: 88.6 fL (ref 80.0–100.0)
Platelets: 165 10*3/uL (ref 150–400)
RBC: 3.95 MIL/uL (ref 3.87–5.11)
RDW: 12.3 % (ref 11.5–15.5)
WBC: 9.9 10*3/uL (ref 4.0–10.5)
nRBC: 0 % (ref 0.0–0.2)

## 2019-07-09 LAB — ABO/RH: ABO/RH(D): B POS

## 2019-07-09 LAB — URIC ACID: Uric Acid, Serum: 3.9 mg/dL (ref 2.5–7.1)

## 2019-07-09 LAB — LACTATE DEHYDROGENASE: LDH: 185 U/L (ref 98–192)

## 2019-07-09 LAB — POCT FERN TEST: POCT Fern Test: POSITIVE

## 2019-07-09 MED ORDER — COCONUT OIL OIL
1.0000 "application " | TOPICAL_OIL | Status: DC | PRN
  Administered 2019-07-10: 1 via TOPICAL

## 2019-07-09 MED ORDER — PRENATAL MULTIVITAMIN CH
1.0000 | ORAL_TABLET | Freq: Every day | ORAL | Status: DC
  Administered 2019-07-10: 1 via ORAL
  Filled 2019-07-09: qty 1

## 2019-07-09 MED ORDER — IBUPROFEN 600 MG PO TABS
600.0000 mg | ORAL_TABLET | Freq: Four times a day (QID) | ORAL | Status: DC
  Administered 2019-07-10 – 2019-07-11 (×6): 600 mg via ORAL
  Filled 2019-07-09 (×6): qty 1

## 2019-07-09 MED ORDER — FENTANYL-BUPIVACAINE-NACL 0.5-0.125-0.9 MG/250ML-% EP SOLN
EPIDURAL | Status: AC
  Filled 2019-07-09: qty 250

## 2019-07-09 MED ORDER — BENZOCAINE-MENTHOL 20-0.5 % EX AERO
1.0000 "application " | INHALATION_SPRAY | CUTANEOUS | Status: DC | PRN
  Filled 2019-07-09: qty 56

## 2019-07-09 MED ORDER — SENNOSIDES-DOCUSATE SODIUM 8.6-50 MG PO TABS
2.0000 | ORAL_TABLET | ORAL | Status: DC
  Administered 2019-07-10 – 2019-07-11 (×2): 2 via ORAL
  Filled 2019-07-09 (×2): qty 2

## 2019-07-09 MED ORDER — OXYTOCIN BOLUS FROM INFUSION
500.0000 mL | Freq: Once | INTRAVENOUS | Status: AC
  Administered 2019-07-09: 500 mL via INTRAVENOUS

## 2019-07-09 MED ORDER — LACTATED RINGERS IV SOLN: 500.0000 mL | INTRAVENOUS | Status: DC | PRN

## 2019-07-09 MED ORDER — OXYTOCIN 40 UNITS IN NORMAL SALINE INFUSION - SIMPLE MED
2.5000 [IU]/h | INTRAVENOUS | Status: DC
  Filled 2019-07-09: qty 1000

## 2019-07-09 MED ORDER — TETANUS-DIPHTH-ACELL PERTUSSIS 5-2.5-18.5 LF-MCG/0.5 IM SUSP: 0.5000 mL | Freq: Once | INTRAMUSCULAR | Status: DC

## 2019-07-09 MED ORDER — ONDANSETRON HCL 4 MG/2ML IJ SOLN: 4.0000 mg | INTRAMUSCULAR | Status: DC | PRN

## 2019-07-09 MED ORDER — SODIUM CHLORIDE (PF) 0.9 % IJ SOLN
INTRAMUSCULAR | Status: DC | PRN
  Administered 2019-07-09: 12 mL/h via EPIDURAL

## 2019-07-09 MED ORDER — ACETAMINOPHEN 325 MG PO TABS: 650.0000 mg | ORAL_TABLET | ORAL | Status: DC | PRN

## 2019-07-09 MED ORDER — DIBUCAINE (PERIANAL) 1 % EX OINT: 1.0000 "application " | TOPICAL_OINTMENT | CUTANEOUS | Status: DC | PRN

## 2019-07-09 MED ORDER — ONDANSETRON HCL 4 MG/2ML IJ SOLN: 4.0000 mg | Freq: Four times a day (QID) | INTRAMUSCULAR | Status: DC | PRN

## 2019-07-09 MED ORDER — SOD CITRATE-CITRIC ACID 500-334 MG/5ML PO SOLN: 30.0000 mL | ORAL | Status: DC | PRN

## 2019-07-09 MED ORDER — ACETAMINOPHEN 325 MG PO TABS
650.0000 mg | ORAL_TABLET | ORAL | Status: DC | PRN
  Administered 2019-07-10 – 2019-07-11 (×5): 650 mg via ORAL
  Filled 2019-07-09 (×5): qty 2

## 2019-07-09 MED ORDER — TERBUTALINE SULFATE 1 MG/ML IJ SOLN: 0.2500 mg | Freq: Once | INTRAMUSCULAR | Status: DC | PRN

## 2019-07-09 MED ORDER — EPHEDRINE 5 MG/ML INJ: 10.0000 mg | INTRAVENOUS | Status: DC | PRN

## 2019-07-09 MED ORDER — ONDANSETRON HCL 4 MG PO TABS: 4.0000 mg | ORAL_TABLET | ORAL | Status: DC | PRN

## 2019-07-09 MED ORDER — OXYCODONE-ACETAMINOPHEN 5-325 MG PO TABS: 1.0000 | ORAL_TABLET | ORAL | Status: DC | PRN

## 2019-07-09 MED ORDER — LACTATED RINGERS IV SOLN: 500.0000 mL | Freq: Once | INTRAVENOUS | Status: DC

## 2019-07-09 MED ORDER — PHENYLEPHRINE 40 MCG/ML (10ML) SYRINGE FOR IV PUSH (FOR BLOOD PRESSURE SUPPORT)
PREFILLED_SYRINGE | INTRAVENOUS | Status: AC
  Filled 2019-07-09: qty 10

## 2019-07-09 MED ORDER — FENTANYL-BUPIVACAINE-NACL 0.5-0.125-0.9 MG/250ML-% EP SOLN: 12.0000 mL/h | EPIDURAL | Status: DC | PRN

## 2019-07-09 MED ORDER — LIDOCAINE HCL (PF) 1 % IJ SOLN
INTRAMUSCULAR | Status: DC | PRN
  Administered 2019-07-09 (×2): 4 mL via EPIDURAL

## 2019-07-09 MED ORDER — LIDOCAINE HCL (PF) 1 % IJ SOLN: 30.0000 mL | INTRAMUSCULAR | Status: DC | PRN

## 2019-07-09 MED ORDER — SIMETHICONE 80 MG PO CHEW: 80.0000 mg | CHEWABLE_TABLET | ORAL | Status: DC | PRN

## 2019-07-09 MED ORDER — ZOLPIDEM TARTRATE 5 MG PO TABS: 5.0000 mg | ORAL_TABLET | Freq: Every evening | ORAL | Status: DC | PRN

## 2019-07-09 MED ORDER — OXYTOCIN 40 UNITS IN NORMAL SALINE INFUSION - SIMPLE MED
1.0000 m[IU]/min | INTRAVENOUS | Status: DC
  Administered 2019-07-09: 2 m[IU]/min via INTRAVENOUS

## 2019-07-09 MED ORDER — DIPHENHYDRAMINE HCL 50 MG/ML IJ SOLN: 12.5000 mg | INTRAMUSCULAR | Status: DC | PRN

## 2019-07-09 MED ORDER — PHENYLEPHRINE 40 MCG/ML (10ML) SYRINGE FOR IV PUSH (FOR BLOOD PRESSURE SUPPORT): 80.0000 ug | PREFILLED_SYRINGE | INTRAVENOUS | Status: DC | PRN

## 2019-07-09 MED ORDER — LACTATED RINGERS IV SOLN: INTRAVENOUS | Status: DC

## 2019-07-09 MED ORDER — DIPHENHYDRAMINE HCL 25 MG PO CAPS: 25.0000 mg | ORAL_CAPSULE | Freq: Four times a day (QID) | ORAL | Status: DC | PRN

## 2019-07-09 MED ORDER — WITCH HAZEL-GLYCERIN EX PADS: 1.0000 "application " | MEDICATED_PAD | CUTANEOUS | Status: DC | PRN

## 2019-07-09 MED ORDER — OXYCODONE-ACETAMINOPHEN 5-325 MG PO TABS: 2.0000 | ORAL_TABLET | ORAL | Status: DC | PRN

## 2019-07-09 MED ORDER — OXYTOCIN 40 UNITS IN NORMAL SALINE INFUSION - SIMPLE MED: 1.0000 m[IU]/min | INTRAVENOUS | Status: DC

## 2019-07-09 NOTE — H&P (Addendum)
Gabrielle Hill is a 44 y.o. female, [id number], IUP at 38.1 weeks, presenting for PROM @ 0715, no cxt yet. Pt endorse + Fm. Denies vaginal bleeding. Denies feeling cxt's. GBS-. [place] 2/9 was 7.8lbs. Pt desires BTL PP. Pt had elevated BP in MAU when I reviewed the flowsheet, pt was cxt and nervous, denies HA, RUQ pain or vision changes.   Pregnancy Problems: abnormal cervical Papanicolaou smear: Cryotherapy 1997, normal pap 2018. advanced maternal age gravida: 44yo; Panorama with gender--low risk anemia of pregnancy: Hgb 10.2 at 28 weeks, recommended Fe. Anxiety: No meds history of miscarriage pregnancy-induced hypertension: With first pregnancy, mild last pregnancy shoulder girdle dystocia: 2nd baby, delivered by Dr. Andrey Spearman maneuvers needed, weight 8+11 at 40 weeks. 3rd baby 7+13, no delivery issues. Pt would like vaginal delivery, see 03-06-19 visit. vanishing twin syndrome: IUFD Twin B noted at [redacted] weeks gestation (had measured 1 week behind at 7 week [place]), empty sac at 14 weeks.  Patient Active Problem List   Diagnosis Date Noted  . Normal labor and delivery 07/09/2019  . Patient desires pregnancy 07/09/2018  . Amenorrhea 07/09/2018  . Hot flashes 11/26/2017  . Advanced maternal age in multigravida 06/30/2014  . 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 H/O cryosurgery of cervix complicating pregnancy 06/30/2014  . Prior pregnancy complicated by [facility], antepartum 06/30/2014  . Anxiety 03/21/2011  . Abnormal cervical Papanicolaou smear 05/22/1996     Medications Prior to Admission  Medication Sig Dispense Refill Last Dose  . ferrous sulfate 325 (65 FE) MG EC tablet Take 325 mg by mouth every other day.   Past Week at Unknown time  . Prenatal MV-Min-Fe Fum-FA-DHA (PRENATAL MULTI +DHA) 27-0.8-200 MG CAPS Take 1 capsule by mouth daily. 30 capsule 11 07/08/2019 at Unknown time    Past Medical History:  Diagnosis Date  . Anemia   .  Anxiety   . Pregnancy induced hypertension    in first pregnancy 16 years ago  . Vaginal Pap smear, abnormal      No current facility-administered medications on file prior to encounter.   Current Outpatient Medications on File Prior to Encounter  Medication Sig Dispense Refill  . ferrous sulfate 325 (65 FE) MG EC tablet Take 325 mg by mouth every other day.    . Prenatal MV-Min-Fe Fum-FA-DHA (PRENATAL MULTI +DHA) 27-0.8-200 MG CAPS Take 1 capsule by mouth daily. 30 capsule 11     No Known Allergies  History of present pregnancy: Pt Info/Preference:  Screening/Consents:  Labs:   EDD: Estimated Date of Delivery: 07/22/19  Establised: Patient's last menstrual period was 10/13/2018.  Anatomy Scan: Date: 03/06/2019 Placenta Location: posterior Genetic Screen: Panoroma:low risk female AFP: Nreg First Tri: Quad:  Office: [facility]            First PNV: 14.4 wg Blood Type --/--/B POS (02/17 1028)  Language: english Last PNV: 38.1 wg Rhogam    Flu Vaccine:  utd   Antibody NEG (02/17 1028)  TDaP vaccine utd   GTT: Early: 4.8 hga1c Third Trimester: 72  Feeding Plan: breast BTL: Yes, consent signed Rubella: Immune (08/06 0000)  Contraception: Desires PP BTL VBAC: no RPR: Nonreactive (08/06 0000)   Circumcision: In pt desired    HBsAg: Negative (08/06 0000)  Pediatrician:  summer   HIV: Non-reactive (08/06 0000)   Prenatal Classes: no Additional [place]: 2/9 see below growth  GBS: Negative/-- (02/02 0000)(For PCN allergy, check sensitivities)       Chlamydia:  neg    MFM Referral/Consult:  GC: neg  Support Person: husband   PAP: 2018 normal  Pain Management: epidural Neonatologist Referral:  Hgb Electrophoresis:  AA  Birth Plan: non   Hgb NOB: 11.4    28W: 10.6  07/08/18  OB History    Gravida  6   Para  3   Term  3   Preterm      AB  2   Living  2     SAB  2   TAB      Ectopic      Multiple  0   Live Births  2          Past Medical History:  Diagnosis Date  . Anemia   .  Anxiety   . Pregnancy induced hypertension    in first pregnancy 16 years ago  . Vaginal Pap smear, abnormal    Past Surgical History:  Procedure Laterality Date  . CRYOTHERAPY    . DILATION AND EVACUATION N/A 05/23/2013   Procedure: DILATATION AND EVACUATION;  Surgeon: Marylynn Pearson, MD;  Location: [facility];  Service: Gynecology;  Laterality: N/A;  . HIP SURGERY     Family History: family history includes Arthritis in her father; Cancer in her maternal grandmother and paternal grandmother; Dementia in her father; Diabetes in her maternal aunt and maternal uncle; Sarcoidosis in her mother. Social History:  reports that she has never smoked. She has never used smokeless tobacco. She reports current alcohol use. She reports that she does not use drugs.   Prenatal Transfer Tool  Maternal Diabetes: No Genetic Screening: Normal Maternal Ultrasounds/Referrals: Normal Fetal Ultrasounds or other Referrals:  None Maternal Substance Abuse:  No Significant Maternal Medications:  None Significant Maternal Lab Results: Group B Strep negative  ROS:  Review of Systems  Constitutional: Negative.   HENT: Negative.   Eyes: Negative.   Respiratory: Negative.   Cardiovascular: Positive for leg swelling.  Gastrointestinal: Positive for abdominal pain.  Genitourinary:       Leakage of fluid   Musculoskeletal: Negative.   Skin: Negative.   Neurological: Negative.   Endo/Heme/Allergies: Negative.   Psychiatric/Behavioral: Negative.      Physical Exam: BP 122/60   Pulse (!) 135   Temp 97.8 F (36.6 C) (Oral)   Resp 18   Ht 5\' 7"  (1.702 m)   Wt 122.9 kg   LMP 10/13/2018   SpO2 97%   BMI 42.44 kg/m   Physical Exam  Constitutional: She is oriented to person, place, and time and well-developed, well-nourished, and in no distress.  HENT:  Head: Normocephalic and atraumatic.  Eyes: Pupils are equal, round, and reactive to light. Conjunctivae are normal.  Cardiovascular: Normal rate and  regular rhythm.  Pulmonary/Chest: Effort normal and breath sounds normal.  Abdominal: Soft. Bowel sounds are normal.  Genitourinary:    Genitourinary Comments: Pelvis adequate for vaginal delivery, uterus gravida equal to dates, soft non-tender    Musculoskeletal:        General: Edema present. Normal range of motion.     Cervical back: Normal range of motion and neck supple.  Neurological: She is alert and oriented to person, place, and time. Gait normal.  Skin: Skin is warm and dry.  Psychiatric: Affect normal.  Nursing note and vitals reviewed.    NST: FHR baseline 140 bpm, Variability: moderate, Accelerations:present, Decelerations:  Absent= Cat 1/Reactive UC:   none SVE:   Dilation: 4 Effacement (%): 50 Station: -3 Exam by:: [place]  Louellen Molder, RN, vertex verified by fetal sutures.  Leopold's: Position vertex, EFW 8 via leopold's.  Pelvic proven to 8.11 Labs: Results for orders placed or performed during the hospital encounter of 07/09/19 (from the past 24 hour(s))  POCT fern test     Status: None   Collection Time: 07/09/19  9:43 AM  Result Value Ref Range   POCT Fern Test Positive = ruptured amniotic membanes   Respiratory Panel by RT PCR (Flu A&B, Covid) - Nasopharyngeal Swab     Status: None   Collection Time: 07/09/19 10:01 AM   Specimen: Nasopharyngeal Swab  Result Value Ref Range   SARS Coronavirus 2 by RT PCR NEGATIVE NEGATIVE   Influenza A by PCR NEGATIVE NEGATIVE   Influenza B by PCR NEGATIVE NEGATIVE  CBC     Status: Abnormal   Collection Time: 07/09/19 10:28 AM  Result Value Ref Range   WBC 9.9 4.0 - 10.5 K/uL   RBC 3.95 3.87 - 5.11 MIL/uL   Hemoglobin 11.0 (L) 12.0 - 15.0 g/dL   HCT 83.6 (L) 62.9 - 47.6 %   MCV 88.6 80.0 - 100.0 fL   MCH 27.8 26.0 - 34.0 pg   MCHC 31.4 30.0 - 36.0 g/dL   RDW 54.6 50.3 - 54.6 %   Platelets 165 150 - 400 K/uL   nRBC 0.0 0.0 - 0.2 %  Type and screen [facility]     Status: None   Collection Time: 07/09/19  10:28 AM  Result Value Ref Range   ABO/RH(D) B POS    Antibody Screen NEG    Sample Expiration      07/12/2019,2359 Performed at [facility], [address], [place], [place] [address]     Imaging:  No results found.  MAU Course: Orders Placed This Encounter  Procedures  . Respiratory Panel by RT PCR (Flu A&B, Covid) - Nasopharyngeal Swab  . CBC  . RPR  . Urinalysis, Routine w reflex microscopic  . Comprehensive metabolic panel  . Protein / creatinine ratio, urine  . Uric acid  . Lactate dehydrogenase  . Diet clear liquid Room service appropriate? Yes; Fluid consistency: Thin  . Contraction - monitoring  . External fetal heart monitoring  . Vaginal exam  . Vitals signs per unit policy  . Notify Physician  . Fetal monitoring per unit policy  . Activity as tolerated  . Cervical Exam  . Measure blood pressure post delivery every 15 min x 1 hour then every 30 min x 1 hour  . Fundal check post delivery every 15 min x 1 hour then every 30 min x 1 hour  . If Rapid HIV test positive or known HIV positive: initiate AZT orders  . May in and out cath x 2 for inability to void  . Insert foley catheter  . Discontinue foley prior to vaginal delivery  . Initiate Carrier Fluid Protocol  . Initiate Oral Care Protocol  . Order Rapid HIV per protocol if no results on chart  . Practitioner attestation of consent  . Patient may have epidural placement upon request  . May use local infiltration of 1% lidocaine plain to produce a skin wheal prior to IV insertion  . Notify in-house Anesthesia team of nausea and vomiting greater than 5 hours  . Assess for signs/symptoms of PIH/preeclampsia  . RN to place order for: CBC if one has not been drawn in the past 6 hours for all patients with hypertensive disease, pre-eclampsia, eclampsia,  thrombocytopenia or previous PLTC<150,000.  . Identify to Anesthesia if patient plans to have postpartum tubal ligation; do not remove epidural without  discussion with Anesthesiologist  . Vital signs following Epidural Placement, re-bolus or re-dose monitor patient's BP and oxygen saturation every 5 minutes for 30 minutes  . RN to remain at bedside continuously for 30 minutes post epidural placement, post re-bolus / re-dose  . Notify Anesthesia if the patient becomes short of breath or complains of heaviness in chest, chest pain, and/or unrelieved pain  . Notify Anesthesia prior to discontinuing epidural infusion  . May use local infiltration of 1% lidocaine plain to produce a skin wheal prior to IV insertion  . Assess for signs/symptoms of PIH/preeclampsia  . RN to place order for: CBC if one has not been drawn in the past 6 hours for all patients with hypertensive disease, pre-eclampsia, eclampsia, thrombocytopenia or previous PLTC<150,000.  . Identify to Anesthesia if patient plans to have postpartum tubal ligation; do not remove epidural without discussion with Anesthesiologist  . Vital signs following Epidural Placement, re-bolus or re-dose monitor patient's BP and oxygen saturation every 5 minutes for 30 minutes  . RN to remain at bedside continuously for 30 minutes post epidural placement, post re-bolus / re-dose  . Notify Anesthesia if the patient becomes short of breath or complains of heaviness in chest, chest pain, and/or unrelieved pain  . Notify Anesthesia prior to discontinuing epidural infusion  . Evaluate fetal heart rate to establish reassuring pattern prior to initiating Cytotec or Pitocin  . Perform a cervical exam prior to initiating Cytotec or Pitocin  . Discontinue Pitocin if tachysystole with non-reassuring FHR is present  . Nofify MD/CNM if tachysystole with non-reassuring FHR is present  . Initiate intrauterine resuscitation if tachysystole with non-reasuring FHR is present  . If tachysystole WITH reassuring FHR present notify MD / CNM  . May administer Terbutaline 0.25 mg SQ x 1 dose if tachysystole with non-reassuring  FHR is presesnt  . Labor Augmentation  . Evaluate fetal heart rate to establish reassuring pattern prior to initiating Cytotec or Pitocin  . Perform a cervical exam prior to initiating Cytotec or Pitocin  . Discontinue Pitocin if tachysystole with non-reassuring FHR is present  . Nofify MD/CNM if tachysystole with non-reassuring FHR is present  . Initiate intrauterine resuscitation if tachysystole with non-reasuring FHR is present  . If tachysystole WITH reassuring FHR present notify MD / CNM  . May administer Terbutaline 0.25 mg SQ x 1 dose if tachysystole with non-reassuring FHR is presesnt  . Labor Augmentation  . Full code  . POCT fern test  . Type and screen [facility]  . ABO/Rh  . Insert and maintain IV Line  . Admit to Inpatient (patient's expected length of stay will be greater than 2 midnights or inpatient only procedure)   Meds ordered this encounter  Medications  . lactated ringers infusion  . oxytocin (PITOCIN) IV BOLUS FROM BAG  . oxytocin (PITOCIN) IV infusion 40 units in NS 1000 mL - Premix  . lactated ringers infusion 500-1,000 mL  . acetaminophen (TYLENOL) tablet 650 mg  . oxyCODONE-acetaminophen (PERCOCET/ROXICET) 5-325 MG per tablet 1 tablet  . oxyCODONE-acetaminophen (PERCOCET/ROXICET) 5-325 MG per tablet 2 tablet  . ondansetron (ZOFRAN) injection 4 mg  . sodium citrate-citric acid (ORACIT) solution 30 mL  . lidocaine (PF) (XYLOCAINE) 1 % injection 30 mL  . ePHEDrine injection 10 mg  . PHENYLephrine 40 mcg/ml in normal saline Adult IV Push Syringe  .  lactated ringers infusion 500 mL  . fentaNYL 2 mcg/mL w/ bupivacaine 0.125% in NS 250 mL epidural infusion ([facility])  . diphenhydrAMINE (BENADRYL) injection 12.5 mg  . ePHEDrine injection 10 mg  . PHENYLephrine 40 mcg/ml in normal saline Adult IV Push Syringe  . phenylephrine 0.4-0.9 MG/10ML-% injection    Koran, Heather   : cabinet override  . fentaNYL 2 mcg/mL w/bupivacaine 0.125% in NS 250  mL 0.5-0.125-0.9 MG/250ML-%    Koran, Heather   : cabinet override  . lactated ringers infusion 500 mL  . terbutaline (BRETHINE) injection 0.25 mg  . oxytocin (PITOCIN) IV infusion 40 units in NS 1000 mL - Premix    Order Specific Question:   Begin infusion at:    Answer:   2 milli-units/min (3 mL/hr)    Order Specific Question:   Increase infusion by:    Answer:   2 milli-units/min (3 mL/hr)  . terbutaline (BRETHINE) injection 0.25 mg  . oxytocin (PITOCIN) IV infusion 40 units in NS 1000 mL - Premix    Order Specific Question:   Begin infusion at:    Answer:   2 milli-units/min (3 mL/hr)    Order Specific Question:   Increase infusion by:    Answer:   2 milli-units/min (3 mL/hr)    Assessment/Plan: Gabrielle Hill is a 44 y.o. female, [id number], IUP at 38.1 weeks, presenting for PROM @ 0715, no cxt yet. Pt endorse + Fm. Denies vaginal bleeding. Denies feeling cxt's. GBS-. [place] 2/9 was 7.8lbs. Desires PP BTL.  Pt had elevated BP in MAU when I reviewed the flowsheet, pt was cxt and nervous, denies HA, RUQ pain or vision changes.   Pregnancy Problems: abnormal cervical Papanicolaou smear: Cryotherapy 1997, normal pap 2018. advanced maternal age gravida: 44yo; Panorama with gender--low risk anemia of pregnancy: Hgb 10.2 at 28 weeks, recommended Fe. Anxiety: No meds history of miscarriage pregnancy-induced hypertension: With first pregnancy, mild last pregnancy shoulder girdle dystocia: 2nd baby, delivered by Dr. Andrey Spearman maneuvers needed, weight 8+11 at 40 weeks. 3rd baby 7+13, no delivery issues. Pt would like vaginal delivery, see 03-06-19 visit. vanishing twin syndrome: IUFD Twin B noted at [redacted] weeks gestation (had measured 1 week behind at 7 week [place]), empty sac at 14 weeks.  I discussed with patient risks, benefits and alternatives of labor augmention including higher risk of cesarean delivery compared to spontaneous labor.  We discussed risks of induction agents including effects  on fetal heart beat, contraction pattern and need for close monitoring.  Patient expressed understanding of all this and desired to proceed with the induction. Risks and benefits of induction were reviewed, including failure of method, prolonged labor, need for further intervention, risk of cesarean.  Patient and family verbalized understanding and denies any further questions at this time. Pt and family wish to proceed with augmention process. Discussed induction options of Cytotec, foley, and pitocin were reviewed as well as risks and benefits with use of each discussed.  FWB: Cat 1 Fetal Tracing.   Plan: Admit to Birthing Suite per consult with Dr Estanislado Pandy Routine [facility] orders Epidural placed at 1140 Start pitocin 2x2 per protocol per pt.  Anticipate labor progression  PP BTL for contraception.  Baby Female in pt circ desired.  Pending PCR, CMP, Uric acid, LDH, monitor BP, labetalol protocol if indicated.   Dr Estanislado Pandy aware and verbilized agreement with plan of care.   Dale  NP-C, CNM, MSN 07/09/2019, 12:41 PM

## 2019-07-09 NOTE — MAU Note (Signed)
Pt reports around 0715 she felt a gush of fluid.  + FM .  Contractions intermittent .

## 2019-07-09 NOTE — Anesthesia Preprocedure Evaluation (Addendum)
Anesthesia Evaluation  Patient identified by MRN, date of birth, ID band Patient awake    Reviewed: Allergy & Precautions, H&P , Patient's Chart, lab work & pertinent test results  Airway Mallampati: II  TM Distance: >3 FB Neck ROM: full    Dental no notable dental hx. (+) Teeth Intact   Pulmonary neg pulmonary ROS,    Pulmonary exam normal breath sounds clear to auscultation       Cardiovascular hypertension, negative cardio ROS Normal cardiovascular exam Rhythm:regular Rate:Normal     Neuro/Psych PSYCHIATRIC DISORDERS Anxiety negative neurological ROS     GI/Hepatic negative GI ROS, Neg liver ROS,   Endo/Other  Morbid obesity  Renal/GU negative Renal ROS  negative genitourinary   Musculoskeletal   Abdominal   Peds  Hematology  (+) Blood dyscrasia, anemia ,   Anesthesia Other Findings   Reproductive/Obstetrics                            Anesthesia Physical Anesthesia Plan  ASA: III  Anesthesia Plan: Epidural   Post-op Pain Management:    Induction:   PONV Risk Score and Plan:   Airway Management Planned:   Additional Equipment:   Intra-op Plan:   Post-operative Plan:   Informed Consent: I have reviewed the patients History and Physical, chart, labs and discussed the procedure including the risks, benefits and alternatives for the proposed anesthesia with the patient or authorized representative who has indicated his/her understanding and acceptance.       Plan Discussed with: Anesthesiologist  Anesthesia Plan Comments:        Anesthesia Quick Evaluation

## 2019-07-09 NOTE — Anesthesia Procedure Notes (Signed)
Epidural Patient location during procedure: OB Start time: 07/09/2019 11:30 AM End time: 07/09/2019 11:37 AM  Staffing Anesthesiologist: Mal Amabile, MD Performed: anesthesiologist   Preanesthetic Checklist Completed: patient identified, IV checked, site marked, risks and benefits discussed, surgical consent, monitors and equipment checked, pre-op evaluation and timeout performed  Epidural Patient position: sitting Prep: DuraPrep and site prepped and draped Patient monitoring: continuous pulse ox and blood pressure Approach: midline Location: L3-L4 Injection technique: LOR air  Needle:  Needle type: Tuohy  Needle gauge: 17 G Needle length: 9 cm and 9 Needle insertion depth: 6 cm Catheter type: closed end flexible Catheter size: 19 Gauge Catheter at skin depth: 11 cm Test dose: negative and Other  Assessment Events: blood not aspirated, injection not painful, no injection resistance, no paresthesia and negative IV test  Additional Notes Patient identified. Risks and benefits discussed including failed block, incomplete  Pain control, post dural puncture headache, nerve damage, paralysis, blood pressure Changes, nausea, vomiting, reactions to medications-both toxic and allergic and post Partum back pain. All questions were answered. Patient expressed understanding and wished to proceed. Sterile technique was used throughout procedure. Epidural site was Dressed with sterile barrier dressing. No paresthesias, signs of intravascular injection Or signs of intrathecal spread were encountered.  Patient was more comfortable after the epidural was dosed. Please see RN's note for documentation of vital signs and FHR which are stable. Reason for block:procedure for pain

## 2019-07-10 ENCOUNTER — Encounter: Payer: Self-pay | Admitting: *Deleted

## 2019-07-10 ENCOUNTER — Encounter (HOSPITAL_COMMUNITY)
Admission: AD | Disposition: A | Discharge status: Home / Self Care | Payer: Self-pay | Source: Home / Self Care | Attending: Obstetrics and Gynecology

## 2019-07-10 HISTORY — PX: TUBAL LIGATION: SHX77

## 2019-07-10 LAB — RPR: RPR Ser Ql: NONREACTIVE

## 2019-07-10 LAB — CBC
HCT: 32.4 % — ABNORMAL LOW (ref 36.0–46.0)
Hemoglobin: 10.3 g/dL — ABNORMAL LOW (ref 12.0–15.0)
MCH: 27.8 pg (ref 26.0–34.0)
MCHC: 31.8 g/dL (ref 30.0–36.0)
MCV: 87.3 fL (ref 80.0–100.0)
Platelets: 147 10*3/uL — ABNORMAL LOW (ref 150–400)
RBC: 3.71 MIL/uL — ABNORMAL LOW (ref 3.87–5.11)
RDW: 12.3 % (ref 11.5–15.5)
WBC: 10.5 10*3/uL (ref 4.0–10.5)
nRBC: 0 % (ref 0.0–0.2)

## 2019-07-10 SURGERY — LIGATION, FALLOPIAN TUBE, POSTPARTUM
Anesthesia: Epidural | Wound class: Clean Contaminated

## 2019-07-10 MED ORDER — LIDOCAINE 2% (20 MG/ML) 5 ML SYRINGE
INTRAMUSCULAR | Status: AC
  Filled 2019-07-10: qty 5

## 2019-07-10 MED ORDER — MIDAZOLAM HCL 2 MG/2ML IJ SOLN
INTRAMUSCULAR | Status: AC
  Filled 2019-07-10: qty 2

## 2019-07-10 MED ORDER — FAMOTIDINE 20 MG PO TABS
40.0000 mg | ORAL_TABLET | Freq: Once | ORAL | Status: AC
  Administered 2019-07-10: 40 mg via ORAL
  Filled 2019-07-10: qty 2

## 2019-07-10 MED ORDER — BUPIVACAINE HCL (PF) 0.5 % IJ SOLN
INTRAMUSCULAR | Status: AC
  Filled 2019-07-10: qty 30

## 2019-07-10 MED ORDER — FENTANYL CITRATE (PF) 100 MCG/2ML IJ SOLN: 25.0000 ug | INTRAMUSCULAR | Status: DC | PRN

## 2019-07-10 MED ORDER — MIDAZOLAM HCL 2 MG/2ML IJ SOLN
INTRAMUSCULAR | Status: DC | PRN
  Administered 2019-07-10: 2 mg via INTRAVENOUS

## 2019-07-10 MED ORDER — OXYCODONE HCL 5 MG PO TABS
5.0000 mg | ORAL_TABLET | Freq: Four times a day (QID) | ORAL | Status: DC | PRN
  Administered 2019-07-10 – 2019-07-11 (×4): 5 mg via ORAL
  Filled 2019-07-10 (×4): qty 1

## 2019-07-10 MED ORDER — BUPIVACAINE-EPINEPHRINE 0.5% -1:200000 IJ SOLN
INTRAMUSCULAR | Status: DC | PRN
  Administered 2019-07-10: 10 mL
  Administered 2019-07-10: 15 mL

## 2019-07-10 MED ORDER — LACTATED RINGERS IV SOLN: INTRAVENOUS | Status: DC | PRN

## 2019-07-10 MED ORDER — LIDOCAINE-EPINEPHRINE (PF) 2 %-1:200000 IJ SOLN
INTRAMUSCULAR | Status: DC | PRN
  Administered 2019-07-10 (×2): 5 mL via INTRADERMAL

## 2019-07-10 MED ORDER — KETOROLAC TROMETHAMINE 30 MG/ML IJ SOLN: 30.0000 mg | Freq: Once | INTRAMUSCULAR | Status: DC | PRN

## 2019-07-10 MED ORDER — METOCLOPRAMIDE HCL 10 MG PO TABS
10.0000 mg | ORAL_TABLET | Freq: Once | ORAL | Status: AC
  Administered 2019-07-10: 10 mg via ORAL
  Filled 2019-07-10: qty 1

## 2019-07-10 MED ORDER — FENTANYL CITRATE (PF) 100 MCG/2ML IJ SOLN
INTRAMUSCULAR | Status: DC | PRN
  Administered 2019-07-10: 100 ug via INTRAVENOUS

## 2019-07-10 MED ORDER — FENTANYL CITRATE (PF) 100 MCG/2ML IJ SOLN
INTRAMUSCULAR | Status: DC | PRN
  Administered 2019-07-10: 50 ug via INTRAVENOUS

## 2019-07-10 MED ORDER — FENTANYL CITRATE (PF) 100 MCG/2ML IJ SOLN
INTRAMUSCULAR | Status: AC
  Filled 2019-07-10: qty 2

## 2019-07-10 MED ORDER — ONDANSETRON HCL 4 MG/2ML IJ SOLN
INTRAMUSCULAR | Status: AC
  Filled 2019-07-10: qty 2

## 2019-07-10 MED ORDER — LANOLIN HYDROUS EX OINT: TOPICAL_OINTMENT | CUTANEOUS | Status: DC | PRN

## 2019-07-10 MED ORDER — ACETAMINOPHEN 10 MG/ML IV SOLN: 1000.0000 mg | Freq: Once | INTRAVENOUS | Status: DC | PRN

## 2019-07-10 MED ORDER — ONDANSETRON HCL 4 MG/2ML IJ SOLN
INTRAMUSCULAR | Status: DC | PRN
  Administered 2019-07-10: 4 mg via INTRAVENOUS

## 2019-07-10 MED ORDER — LACTATED RINGERS IV SOLN: INTRAVENOUS | Status: DC

## 2019-07-10 SURGICAL SUPPLY — 26 items
CLOSURE WOUND 1/2 X4 (GAUZE/BANDAGES/DRESSINGS)
CLOTH BEACON ORANGE TIMEOUT ST (SAFETY) ×3 IMPLANT
DRSG OPSITE POSTOP 3X4 (GAUZE/BANDAGES/DRESSINGS) ×3 IMPLANT
DURAPREP 26ML APPLICATOR (WOUND CARE) ×3 IMPLANT
ELECT REM PT RETURN 9FT ADLT (ELECTROSURGICAL)
ELECTRODE REM PT RTRN 9FT ADLT (ELECTROSURGICAL) IMPLANT
GLOVE BIO SURGEON STRL SZ 6.5 (GLOVE) ×2 IMPLANT
GLOVE BIO SURGEONS STRL SZ 6.5 (GLOVE) ×1
GLOVE BIOGEL PI IND STRL 7.0 (GLOVE) ×2 IMPLANT
GLOVE BIOGEL PI INDICATOR 7.0 (GLOVE) ×4
GOWN STRL REUS W/TWL LRG LVL3 (GOWN DISPOSABLE) ×6 IMPLANT
NEEDLE HYPO 22GX1.5 SAFETY (NEEDLE) ×3 IMPLANT
NS IRRIG 1000ML POUR BTL (IV SOLUTION) ×3 IMPLANT
PACK ABDOMINAL MINOR (CUSTOM PROCEDURE TRAY) ×3 IMPLANT
PENCIL BUTTON HOLSTER BLD 10FT (ELECTRODE) IMPLANT
PROTECTOR NERVE ULNAR (MISCELLANEOUS) ×3 IMPLANT
SPONGE LAP 4X18 RFD (DISPOSABLE) IMPLANT
STRIP CLOSURE SKIN 1/2X4 (GAUZE/BANDAGES/DRESSINGS) IMPLANT
SUT MNCRL AB 3-0 PS2 27 (SUTURE) ×3 IMPLANT
SUT PLAIN 2 0 (SUTURE) ×4
SUT PLAIN ABS 2-0 CT1 27XMFL (SUTURE) ×2 IMPLANT
SUT VIC AB 0 CT1 27 (SUTURE) ×2
SUT VIC AB 0 CT1 27XBRD ANBCTR (SUTURE) ×1 IMPLANT
SYR CONTROL 10ML LL (SYRINGE) ×3 IMPLANT
TOWEL OR 17X24 6PK STRL BLUE (TOWEL DISPOSABLE) ×6 IMPLANT
TRAY FOLEY CATH SILVER 14FR (SET/KITS/TRAYS/PACK) ×3 IMPLANT

## 2019-07-10 NOTE — Op Note (Signed)
reop diagnosis: Multiparity desires sterilization  Postop diagnosis: Same  Procedure: Postpartum tubal ligation Surgeon: Jarnell Cordaro Anesthesia:Epidural Estimated blood loss: Minimal Complications: None  Pathology: Bilateral portion of tubes Condition and transferred to PACU: stable   Before the tubal ligation was done, the patient was told that the risks are but not limited to bleeding infection damage to internal organs such as bowel bladder major blood vessels. The patient understands that the failure rate is 1 in 200. She understands a half of those failures can result in ectopic or tubal pregnancy. The patient was also well aware of all birth control that was available to her. The patient was taken to the operating room. Once her epidural was found to be adequate, a timeout was done. The patient's consent was signed.  20 cc of local was she used to infiltrate around the umbilicus to obtain adequate anesthesia. This amounts was used because the patient felt prickly touch with the Allis clamp. Once all the anesthesia was found to be adequate, I began the procedure. A 10 mm infraumbilical incision was made along her previous incision. The fascia was then grasped with 2 cokers after the subcutaneous tissue had been bluntly and sharply dissected. The fascia was then incised and extended bilaterally. The peritoneum was entered bluntly. The patient's right fallopian tube was grasped with Babcock clamp. Follow out to the fimbriated end. And a half a centimeter of the mid isthmic portion of the tube was ligated with 2-0 plain and excised. The patient's left fallopian tube was grasped with Babcock clamp followed out to the fimbriated end. About a centimeter of the mid isthmic portion of the tube was ligated with 2-0 plain and excised. Both tubes were returned to the abdomen. Hemostasis was noted. The peritoneum was closed with 0 Vicryl. The fascia was also closed using 0 Vicryl. The skin was closed with 3-0  Monocryl in subcuticular fashion. Sponge lap and needle counts were correct. Patient went to recovery in stable condition.  

## 2019-07-10 NOTE — Transfer of Care (Signed)
Immediate Anesthesia Transfer of Care Note  Patient: Gabrielle Hill  Procedure(s) Performed: POST PARTUM TUBAL LIGATION (N/A )  Patient Location: PACU  Anesthesia Type:Epidural  Level of Consciousness: awake and alert   Airway & Oxygen Therapy: Patient Spontanous Breathing  Post-op Assessment: Report given to RN and Post -op Vital signs reviewed and stable  Post vital signs: Reviewed and stable  Last Vitals:  Vitals Value Taken Time  BP 120/86 07/10/19 1215  Temp    Pulse 81 07/10/19 1225  Resp 15 07/10/19 1225  SpO2 95 % 07/10/19 1225  Vitals shown include unvalidated device data.  Last Pain:  Vitals:   07/10/19 1204  TempSrc: Oral  PainSc: 0-No pain      Patients Stated Pain Goal: 6 (07/09/19 1112)  Complications: No apparent anesthesia complications

## 2019-07-10 NOTE — Progress Notes (Signed)
Subjective: Postpartum Day # 1 : S/P NSVD due to PROM with spontaneous labor, h/o PIH during this pregnancy stable, elevated Bp prior to epidural placement, labs unremarkable, PCR was 0.23, BP normotensive PP, denies HA, RUQ pain or vision changes. Pt plans to go for BLT at 1030am and has been NPO since MN. Baby female will get in pt circ today.  Patient up ad lib, denies syncope or dizziness. Reports consuming regular diet without issues and denies N/V. Patient reports 0 bowel movement + passing flatus.  Denies issues with urination and reports bleeding is "lighter."  Patient is breastfeeding and reports going well.  Desires BTL today at 27 for postpartum contraception.  Pain is being appropriately managed with use of po meds.   NO laceration Feeding:  breast Contraceptive plan:  BTL today @ 1030 BB: Circ inpt today by Dr Charlesetta Garibaldi  Objective: Vital signs in last 24 hours: Patient Vitals for the past 24 hrs:  BP Temp Temp src Pulse Resp SpO2 Height Weight  07/10/19 0547 115/87 97.8 F (36.6 C) Oral 80 18 99 % -- --  07/10/19 0010 114/60 98.2 F (36.8 C) Oral 98 18 98 % -- --  07/09/19 2015 128/70 98.2 F (36.8 C) Oral (!) 108 18 98 % -- --  07/09/19 1815 134/77 98.1 F (36.7 C) Oral (!) 106 19 -- -- --  07/09/19 1733 136/86 97.9 F (36.6 C) Oral (!) 112 20 -- -- --  07/09/19 1716 (!) 142/99 -- -- (!) 103 -- -- -- --  07/09/19 1701 (!) 144/101 -- -- -- 16 -- -- --  07/09/19 1646 126/71 -- -- (!) 107 18 -- -- --  07/09/19 1631 124/71 -- -- (!) 111 16 -- -- --  07/09/19 1531 121/62 -- -- 91 18 -- -- --  07/09/19 1501 120/62 -- -- 83 16 -- -- --  07/09/19 1431 133/65 97.8 F (36.6 C) Oral 97 20 -- -- --  07/09/19 1401 126/74 -- -- 87 20 -- -- --  07/09/19 1331 122/82 -- -- 97 18 -- -- --  07/09/19 1301 121/65 -- -- 83 16 -- -- --  07/09/19 1231 122/60 97.8 F (36.6 C) Oral (!) 135 18 -- -- --  07/09/19 1215 -- -- -- -- -- 97 % -- --  07/09/19 1208 (!) 110/58 -- -- 94 20 99 % -- --   07/09/19 1206 (!) 110/58 -- -- 94 -- -- -- --  07/09/19 1201 112/61 -- -- 98 -- -- -- --  07/09/19 1156 (!) 95/55 -- -- (!) 108 16 -- -- --  07/09/19 1155 -- -- -- -- -- 99 % -- --  07/09/19 1151 130/76 -- -- (!) 106 18 -- -- --  07/09/19 1150 -- -- -- -- -- 99 % -- --  07/09/19 1146 (!) 135/98 -- -- (!) 157 16 -- -- --  07/09/19 1145 -- -- -- -- -- 99 % -- --  07/09/19 1141 (!) 144/83 -- -- (!) 133 18 -- -- --  07/09/19 1140 -- -- -- -- -- 100 % -- --  07/09/19 1137 (!) 176/77 -- -- (!) 105 -- -- -- --  07/09/19 1135 -- -- -- -- -- 100 % -- --  07/09/19 1131 (!) 173/85 -- -- 85 -- -- -- --  07/09/19 1130 -- -- -- -- -- 99 % -- --  07/09/19 1125 -- -- -- -- -- 99 % -- --  07/09/19 1114 (!) 145/74 -- -- 80 18 -- -- --  07/09/19 1113 -- -- -- -- -- -- 5\' 7"  (1.702 m) 122.9 kg  07/09/19 0935 135/79 -- -- 81 18 100 % -- 122.9 kg     Physical Exam:  General: alert, cooperative, appears stated age and no distress Mood/Affect: Happy Lungs: clear to auscultation, no wheezes, rales or rhonchi, symmetric air entry.  Heart: normal rate, regular rhythm, normal S1, S2, no murmurs, rubs, clicks or gallops. Breast: breasts appear normal, no suspicious masses, no skin or nipple changes or axillary nodes. Abdomen:  + bowel sounds, soft, non-tender GU: perineum Intact, healing well. No signs of external hematomas.  Uterine Fundus: firm Lochia: appropriate Skin: Warm, Dry. DVT Evaluation: No evidence of DVT seen on physical exam. Negative Homan's sign. No cords or calf tenderness. 1+ BLE pitting edema 2+ Patellar DTR, no clonus   CBC Latest Ref Rng & Units 07/10/2019 07/09/2019 11/26/2017  WBC 4.0 - 10.5 K/uL 10.5 9.9 5.1  Hemoglobin 12.0 - 15.0 g/dL 10.3(L) 11.0(L) 12.2  Hematocrit 36.0 - 46.0 % 32.4(L) 35.0(L) 37.1  Platelets 150 - 400 K/uL 147(L) 165 135.0(L)    Results for orders placed or performed during the hospital encounter of 07/09/19 (from the past 24 hour(s))  POCT fern test      Status: None   Collection Time: 07/09/19  9:43 AM  Result Value Ref Range   POCT Fern Test Positive = ruptured amniotic membanes   Respiratory Panel by RT PCR (Flu A&B, Covid) - Nasopharyngeal Swab     Status: None   Collection Time: 07/09/19 10:01 AM   Specimen: Nasopharyngeal Swab  Result Value Ref Range   SARS Coronavirus 2 by RT PCR NEGATIVE NEGATIVE   Influenza A by PCR NEGATIVE NEGATIVE   Influenza B by PCR NEGATIVE NEGATIVE  CBC     Status: Abnormal   Collection Time: 07/09/19 10:28 AM  Result Value Ref Range   WBC 9.9 4.0 - 10.5 K/uL   RBC 3.95 3.87 - 5.11 MIL/uL   Hemoglobin 11.0 (L) 12.0 - 15.0 g/dL   HCT 07/11/19 (L) 32.9 - 19.1 %   MCV 88.6 80.0 - 100.0 fL   MCH 27.8 26.0 - 34.0 pg   MCHC 31.4 30.0 - 36.0 g/dL   RDW 66.0 60.0 - 45.9 %   Platelets 165 150 - 400 K/uL   nRBC 0.0 0.0 - 0.2 %  Type and screen [facility]     Status: None   Collection Time: 07/09/19 10:28 AM  Result Value Ref Range   ABO/RH(D) B POS    Antibody Screen NEG    Sample Expiration      07/12/2019,2359 Performed at [facility], [address], [place], [place]   ABO/Rh     Status: None   Collection Time: 07/09/19 10:28 AM  Result Value Ref Range   ABO/RH(D)      B POS Performed at [facility], [address], [place], [place]   Comprehensive metabolic panel     Status: Abnormal   Collection Time: 07/09/19 10:28 AM  Result Value Ref Range   Sodium 136 135 - 145 mmol/L   Potassium 4.1 3.5 - 5.1 mmol/L   Chloride 104 98 - 111 mmol/L   CO2 22 22 - 32 mmol/L   Glucose, Bld 86 70 - 99 mg/dL   BUN 5 (L) 6 - 20 mg/dL   Creatinine, Ser 07/11/19 0.44 - 1.00 mg/dL   Calcium 8.8 (L) 8.9 - 10.3  mg/dL   Total Protein 6.9 6.5 - 8.1 g/dL   Albumin 2.7 (L) 3.5 - 5.0 g/dL   AST 23 15 - 41 U/L   ALT 18 0 - 44 U/L   Alkaline Phosphatase 165 (H) 38 - 126 U/L   Total Bilirubin 0.6 0.3 - 1.2 mg/dL   GFR calc non Af Amer >60 >60 mL/min   GFR calc Af Amer  >60 >60 mL/min   Anion gap 10 5 - 15  Uric acid     Status: None   Collection Time: 07/09/19 10:28 AM  Result Value Ref Range   Uric Acid, Serum 3.9 2.5 - 7.1 mg/dL  Lactate dehydrogenase     Status: None   Collection Time: 07/09/19 10:28 AM  Result Value Ref Range   LDH 185 98 - 192 U/L  Urinalysis, Routine w reflex microscopic     Status: None   Collection Time: 07/09/19  2:01 PM  Result Value Ref Range   Color, Urine YELLOW YELLOW   APPearance CLEAR CLEAR   Specific Gravity, Urine 1.014 1.005 - 1.030   pH 7.0 5.0 - 8.0   Glucose, UA NEGATIVE NEGATIVE mg/dL   Hgb urine dipstick NEGATIVE NEGATIVE   Bilirubin Urine NEGATIVE NEGATIVE   Ketones, ur NEGATIVE NEGATIVE mg/dL   Protein, ur NEGATIVE NEGATIVE mg/dL   Nitrite NEGATIVE NEGATIVE   Leukocytes,Ua NEGATIVE NEGATIVE  Protein / creatinine ratio, urine     Status: Abnormal   Collection Time: 07/09/19  2:01 PM  Result Value Ref Range   Creatinine, Urine 115.65 mg/dL   Total Protein, Urine 27 mg/dL   Protein Creatinine Ratio 0.23 (H) 0.00 - 0.15 mg/mg[Cre]  CBC     Status: Abnormal   Collection Time: 07/10/19  5:34 AM  Result Value Ref Range   WBC 10.5 4.0 - 10.5 K/uL   RBC 3.71 (L) 3.87 - 5.11 MIL/uL   Hemoglobin 10.3 (L) 12.0 - 15.0 g/dL   HCT 81.1 (L) 91.4 - 78.2 %   MCV 87.3 80.0 - 100.0 fL   MCH 27.8 26.0 - 34.0 pg   MCHC 31.8 30.0 - 36.0 g/dL   RDW 95.6 21.3 - 08.6 %   Platelets 147 (L) 150 - 400 K/uL   nRBC 0.0 0.0 - 0.2 %     CBG (last 3)  No results for input(s): GLUCAP in the last 72 hours.   I/O last 3 completed shifts: In: 0  Out: 50 [Blood:50]   Assessment Postpartum Day # 1 : S/P NSVD due to PROM with spontaneous labor. Pt stable. -1 involution. breastfeeding. H/O PIH, BP now 115/85, asymptomatic, unremarkable labs on 2/14 with elevated BP prior to epidural, post epidural normotensive, no dx made did not meet ghtn criteria. No meds. Desires BTL and circ for baby female. Hemodynamically stable.    Plan: Continue other mgmt as ordered BTL schedule today at 1030 with Dr Abundio Miu female circ to be competed today by Dr Normand Sloop.  VTE prophylactics: Early ambulated as tolerates.  Pain control: Motrin/Tylenol PRN Plan for discharge tomorrow, Breastfeeding, Lactation consult and Circumcision prior to discharge   Dr. Normand Sloop updated on patient status  [facility] NP-C, CNM 07/10/2019, 8:46 AM

## 2019-07-10 NOTE — Progress Notes (Signed)
MOB was referred for history of depression/anxiety. * Referral screened out by Clinical Social Worker because none of the following criteria appear to apply: ~ History of anxiety/depression during this pregnancy, or of post-partum depression following prior delivery. ~ Diagnosis of anxiety and/or depression within last 3 years. Per further chart review, MOB diagnosed with anxiety in 2012 with no concerns documented in [facility] records.  OR * MOB's symptoms currently being treated with medication and/or therapy.   CSW aware that MOB scored 0 on [place] with no concerns to CSW at this time.      Claude Manges Kenner Lewan, MSW, LCSW [facility] [phone]

## 2019-07-10 NOTE — Lactation Note (Addendum)
This note was copied from a baby's chart. Lactation Consultation Note  Patient Name: Gabrielle Hill IWOEH'O Date: 07/10/2019 Reason for consult: Follow-up assessment;Early term 37-38.6wks P3, 31 hour ETI female infant. Infant had 3 voids and 4 stools. Per mom ,infant is more irritable since circumcision earlier today.  Per mom,  she breastfed infant for one hour then gave  infant 25 mls of donor milk at 31 hours of life .  LC did not observe latch due mom feeding infant prior to [facility] entering the room. Per mom, infant is cluster feeding. LC discussed STS for comforting techniques.  LC discussed with mom decrease amount of donor milk based on infant's age and hours offer 7 to 12 mls per feeding but not 24 mls at 30 hours of life.  Discussed pace feeding when using bottle of donor milk. LC discussed with night RN to talk with day RN to put in specific order for infant requirements  due to volume infant may need ( need order more donor milk from the  milk lab).  LC discussed with mom after breastfeeding infant to offer any of her EBM first and then supplement with donor milk. LC gave mom a DEBP and mom will start pumping every 3 hours for 15 minutes on initial setting to help with her  milk supply. RN or LC observe infant latch. Mom will continue to breastfeed infant according to hunger cues, on demand, as much as infant wants to breastfeed, not exceed 3 hours without breastfeeding infant. Parents will continue to do as much STS as possible.    Maternal Data    Feeding Feeding Type: Breast Fed Nipple Type: Slow - flow  LATCH Score                   Interventions    Lactation Tools Discussed/Used Pump Review: Setup, frequency, and cleaning;Milk Storage Initiated by:: Gabrielle Hill, IBCLC Date initiated:: 07/10/19   Consult Status Consult Status: Follow-up Date: 07/11/19 Follow-up type: In-patient    Gabrielle Hill 07/10/2019, 11:08 PM

## 2019-07-10 NOTE — Anesthesia Postprocedure Evaluation (Signed)
Anesthesia Post Note  Patient: Gabrielle Hill  Procedure(s) Performed: POST PARTUM TUBAL LIGATION (N/A )     Patient location during evaluation: PACU Anesthesia Type: Epidural Level of consciousness: awake and alert Pain management: pain level controlled Vital Signs Assessment: post-procedure vital signs reviewed and stable Respiratory status: spontaneous breathing, nonlabored ventilation and respiratory function stable Cardiovascular status: stable Postop Assessment: no headache, no backache and epidural receding Anesthetic complications: no    Last Vitals:  Vitals:   07/10/19 1311 07/10/19 1323  BP: 120/77 108/68  Pulse:    Resp: 16   Temp: (!) 36.4 C 36.4 C  SpO2: 97%     Last Pain:  Vitals:   07/10/19 1323  TempSrc: Oral  PainSc:    Pain Goal: Patients Stated Pain Goal: 6 (07/09/19 1112)                 Scott Fix L Rekisha Welling

## 2019-07-10 NOTE — Lactation Note (Signed)
This note was copied from a baby's chart. Lactation Consultation Note  Patient Name: Gabrielle Hill Date: 07/10/2019 Reason for consult: Initial assessment;Early term 37-38.6wks P3, 10 hour female ETI, female infant. Mom with hx of : PIH and anxiety. Infant had one void and one stool since birth. Mom is experienced at breastfeeding, she breastfeed 1st child for 7 months and 2nd child for 3 weeks then pumped and formula fed infant until she was 3 weeks and stopped due low milk supply.  Per mom, she feels infant is latching well,mom recently finished latching infant at breast for 15 minutes prior to [facility] entering the room at 02:50 am.  Mom will continue to breastfeed infant according to hunger cues, 8 to 12 times within 24 hours and on demand.  Mom knows to call RN or LC if she has any question, concerns or needs lactation assistance with Nursing. Reviewed Baby & Me book's Breastfeeding Basics.  .Mom made aware of O/P services, breastfeeding support groups, community resources, and our phone # for post-discharge questions.     Maternal Data Formula Feeding for Exclusion: No Has patient been taught Hand Expression?: Yes Does the patient have breastfeeding experience prior to this delivery?: Yes  Feeding Feeding Type: Breast Fed  LATCH Score Latch: Grasps breast easily, tongue down, lips flanged, rhythmical sucking.  Audible Swallowing: A few with stimulation  Type of Nipple: Everted at rest and after stimulation  Comfort (Breast/Nipple): Soft / non-tender  Hold (Positioning): Assistance needed to correctly position infant at breast and maintain latch.  LATCH Score: 8  Interventions Interventions: Breast feeding basics reviewed;Skin to skin;Hand express  Lactation Tools Discussed/Used WIC Program: No   Consult Status Consult Status: Follow-up Date: 07/10/19 Follow-up type: In-patient    Danelle Earthly 07/10/2019, 3:06 AM

## 2019-07-10 NOTE — Anesthesia Postprocedure Evaluation (Signed)
Anesthesia Post Note  Patient: Gabrielle Hill  Procedure(s) Performed: AN AD HOC LABOR EPIDURAL     Patient location during evaluation: Mother Baby Anesthesia Type: Epidural Level of consciousness: awake and alert Pain management: pain level controlled Vital Signs Assessment: post-procedure vital signs reviewed and stable Respiratory status: spontaneous breathing Cardiovascular status: stable Postop Assessment: no headache, adequate PO intake, no backache, patient able to bend at knees, able to ambulate, epidural receding and no apparent nausea or vomiting Anesthetic complications: no    Last Vitals:  Vitals:   07/10/19 0547 07/10/19 0900  BP: 115/87 112/70  Pulse: 80 74  Resp: 18 17  Temp: 36.6 C 36.6 C  SpO2: 99%     Last Pain:  Vitals:   07/10/19 0900  TempSrc: Oral  PainSc:    Pain Goal: Patients Stated Pain Goal: 6 (07/09/19 1112)                 Jennye Moccasin, Weldon Picking

## 2019-07-11 MED ORDER — OXYCODONE HCL 5 MG PO TABS: 5.0000 mg | ORAL_TABLET | Freq: Four times a day (QID) | ORAL | 0 refills | Status: DC | PRN

## 2019-07-11 MED ORDER — OXYCODONE HCL 5 MG PO TABS: 5.0000 mg | ORAL_TABLET | Freq: Four times a day (QID) | ORAL | 0 refills | Status: AC | PRN

## 2019-07-11 MED ORDER — IBUPROFEN 600 MG PO TABS: 600.0000 mg | ORAL_TABLET | Freq: Four times a day (QID) | ORAL | 0 refills | Status: AC

## 2019-07-11 NOTE — Discharge Summary (Signed)
SVD OB Discharge Summary     Patient Name: Gabrielle Hill DOB: December 01, 1975 MRN: [id number]  Date of admission: 07/09/2019 Delivering MD: Dale   Date of delivery: 07/09/2019 Type of delivery: SVD  Newborn Data: Sex: Baby female Circumcision: done in pt by DR Dillard Live born female  Birth Weight: 8 lb 11.2 oz (3945 g) APGAR: 8, 9  Newborn Delivery   Birth date/time: 07/09/2019 16:03:00 Delivery type: Vaginal, Spontaneous      Feeding: breast Infant being discharge to home with mother in stable condition.   Admitting diagnosis: Normal labor and delivery [O80] Intrauterine pregnancy: [redacted]w[redacted]d     Secondary diagnosis:  Active Problems:   Normal labor and delivery   PROM (premature rupture of membranes)                                Complications: None                                                              Intrapartum Procedures: spontaneous vaginal delivery Postpartum Procedures: none Complications-Operative and Postpartum: none Augmentation: Pitocin   History of Present Illness: Gabrielle Hill is a 44 y.o. female, [id number], who presents at [redacted]w[redacted]d weeks gestation. The patient has been followed at  [facility]  Her pregnancy has been complicated by:  Patient Active Problem List   Diagnosis Date Noted  . Normal labor and delivery 07/09/2019  . PROM (premature rupture of membranes) 07/09/2019  . Patient desires pregnancy 07/09/2018  . Amenorrhea 07/09/2018  . Hot flashes 11/26/2017  . Advanced maternal age in multigravida 06/30/2014  . 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 H/O cryosurgery of cervix complicating pregnancy 06/30/2014  . Prior pregnancy complicated by [facility], antepartum 06/30/2014  . Anxiety 03/21/2011  . Abnormal cervical Papanicolaou smear 05/22/1996    Hospital course:  Onset of Labor With Vaginal Delivery     44 y.o. yo [id number] at [redacted]w[redacted]d was admitted in  Latent Labor on 07/09/2019. Patient had an uncomplicated labor course as follows:  Membrane Rupture Time/Date: 7:15 AM ,07/09/2019   Intrapartum Procedures: Episiotomy: None [1]                                         Lacerations:    Patient had a delivery of a Viable infant. 07/09/2019  Information for the patient's newborn:  GWYNETH, FERNANDEZ [[id number]]  Delivery Method: Vaginal, Spontaneous(Filed from Delivery Summary)     Pateint had an uncomplicated postpartum course.  She is ambulating, tolerating a regular diet, passing flatus, and urinating well. Patient is discharged home in stable condition on 07/11/19.  Postpartum Day # 2 :  S/P NSVD due to PROM with spontaneous labor, h/o PIH during this pregnancy stable, elevated Bp prior to epidural placement, labs unremarkable, PCR was 0.23, BP normotensive PP, denies HA, RUQ pain or vision changes. Baby female will get in pt circ today.  Patient up ad lib, denies syncope or dizziness. Reports consuming regular diet without issues and denies N/V. Patient reports 0 bowel movement + passing flatus.  Denies issues with urination and reports bleeding is "lighter."  Patient is breastfeeding and reports going well. Received BTL 2/18 at 1030 for postpartum contraception.  Pain is being appropriately managed with use of po meds.. Current BPP 117/66  Physical exam  Vitals:   07/10/19 1815 07/10/19 2139 07/11/19 0227 07/11/19 0631  BP: 104/62 110/62 108/72 117/66  Pulse:  71 71 74  Resp: 18 18 16 18   Temp: 97.8 F (36.6 C) 98.6 F (37 C) 97.8 F (36.6 C) 98.1 F (36.7 C)  TempSrc: Oral Oral Oral Oral  SpO2:  99% 99% 99%  Weight:      Height:       General: alert, cooperative and no distress Lochia: appropriate Uterine Fundus: firm Incision: Healing well with no significant drainage, No significant erythema, Dressing is clean, dry, and intact, honeycomb dressing CDI Perineum: Intact DVT Evaluation: No evidence of DVT seen on physical  exam. Negative Homan's sign. No cords or calf tenderness. 1+ pitting edema to BLE  Labs: Lab Results  Component Value Date   WBC 10.5 07/10/2019   HGB 10.3 (L) 07/10/2019   HCT 32.4 (L) 07/10/2019   MCV 87.3 07/10/2019   PLT 147 (L) 07/10/2019   CMP Latest Ref Rng & Units 07/09/2019  Glucose 70 - 99 mg/dL 86  BUN 6 - 20 mg/dL 5(L)  Creatinine 07/11/2019 - 1.00 mg/dL 7.74  Sodium 1.28 - 786 mmol/L 136  Potassium 3.5 - 5.1 mmol/L 4.1  Chloride 98 - 111 mmol/L 104  CO2 22 - 32 mmol/L 22  Calcium 8.9 - 10.3 mg/dL 767)  Total Protein 6.5 - 8.1 g/dL 6.9  Total Bilirubin 0.3 - 1.2 mg/dL 0.6  Alkaline Phos 38 - 126 U/L 165(H)  AST 15 - 41 U/L 23  ALT 0 - 44 U/L 18    Date of discharge: 07/11/2019 Discharge Diagnoses: Term Pregnancy-delivered Discharge instruction: per After Visit Summary and "Baby and Me Booklet".  After visit meds:   Activity:           unrestricted and pelvic rest Advance as tolerated. Pelvic rest for 6 weeks.  Diet:                routine Medications: PNV, Ibuprofen and oxy IR Postpartum contraception: Tubal Ligation Condition:  Pt discharge to home with baby in stable  Meds: Allergies as of 07/11/2019   No Known Allergies     Medication List    TAKE these medications   ferrous sulfate 325 (65 FE) MG EC tablet Take 325 mg by mouth every other day.   ibuprofen 600 MG tablet Commonly known as: ADVIL Take 1 tablet (600 mg total) by mouth every 6 (six) hours.   oxyCODONE 5 MG immediate release tablet Commonly known as: Oxy IR/ROXICODONE Take 1 tablet (5 mg total) by mouth every 6 (six) hours as needed for severe pain.   Prenatal Multi +DHA 27-0.8-200 MG Caps Take 1 capsule by mouth daily.            Discharge Care Instructions  (From admission, onward)         Start     Ordered   07/11/19 0000  Discharge wound care:    Comments: Take dressing off on day 5-7 postpartum.  Report increased drainage, redness or warmth. Clean with water, let soap  trickle down body. Can leave steri strips on until they fall off or take them off gently at day 10. Keep open to air, clean and dry.  07/11/19 1443          Discharge Follow Up:     Noralyn Pick, NP-C, CNM 07/11/2019, 9:07 AM  Noralyn Pick, FNP

## 2019-07-11 NOTE — Lactation Note (Addendum)
This note was copied from a baby's chart. Lactation Consultation Note  Patient Name: Boy Dominik Lauricella LXBWI'O Date: 07/11/2019 Reason for consult: Follow-up assessment   P3, Baby 40 hours old.  Mother had BTL and was giving larger amounts of donor milk but now is back to frequently breastfeeding. Mother prepumped on L side before latching baby.  Mother is Ex BF.   Observed feeding in football hold with intermittent swallows. Discussed feeding frequency and feeding on demand. Mother has tender nipples.  No cracks or abrasion. She is applying ebm and coconut oil.   Reviewed engorgement care and monitoring voids/stools.    Maternal Data    Feeding Feeding Type: Breast Fed  LATCH Score Latch: Grasps breast easily, tongue down, lips flanged, rhythmical sucking.  Audible Swallowing: A few with stimulation  Type of Nipple: Everted at rest and after stimulation  Comfort (Breast/Nipple): Filling, red/small blisters or bruises, mild/mod discomfort  Hold (Positioning): No assistance needed to correctly position infant at breast.  LATCH Score: 8  Interventions Interventions: Breast feeding basics reviewed;Skin to skin;Pre-pump if needed;DEBP  Lactation Tools Discussed/Used     Consult Status Consult Status: Complete Date: 07/12/19    Dahlia Byes [facility] 07/11/2019, 8:57 AM

## 2019-07-14 ENCOUNTER — Other Ambulatory Visit (HOSPITAL_COMMUNITY): Payer: BC Managed Care – PPO

## 2019-07-14 LAB — SURGICAL PATHOLOGY

## 2019-07-16 ENCOUNTER — Inpatient Hospital Stay (HOSPITAL_COMMUNITY)
Admission: AD | Admit: 2019-07-16 | Payer: BC Managed Care – PPO | Source: Home / Self Care | Admitting: Obstetrics and Gynecology

## 2019-07-16 ENCOUNTER — Inpatient Hospital Stay (HOSPITAL_COMMUNITY): Payer: BC Managed Care – PPO

## 2020-11-03 DIAGNOSIS — R509 Fever, unspecified: Secondary | ICD-10-CM | POA: Diagnosis not present

## 2020-11-03 DIAGNOSIS — M542 Cervicalgia: Secondary | ICD-10-CM | POA: Diagnosis not present

## 2020-11-03 DIAGNOSIS — H6691 Otitis media, unspecified, right ear: Secondary | ICD-10-CM | POA: Diagnosis not present

## 2020-11-03 DIAGNOSIS — Z20822 Contact with and (suspected) exposure to covid-19: Secondary | ICD-10-CM | POA: Diagnosis not present

## 2020-11-03 DIAGNOSIS — J029 Acute pharyngitis, unspecified: Secondary | ICD-10-CM | POA: Diagnosis not present

## 2021-01-03 DIAGNOSIS — Z20822 Contact with and (suspected) exposure to covid-19: Secondary | ICD-10-CM | POA: Diagnosis not present

## 2021-01-03 DIAGNOSIS — R070 Pain in throat: Secondary | ICD-10-CM | POA: Diagnosis not present

## 2021-01-03 DIAGNOSIS — R519 Headache, unspecified: Secondary | ICD-10-CM | POA: Diagnosis not present

## 2021-01-03 DIAGNOSIS — H66001 Acute suppurative otitis media without spontaneous rupture of ear drum, right ear: Secondary | ICD-10-CM | POA: Diagnosis not present

## 2021-01-03 DIAGNOSIS — J069 Acute upper respiratory infection, unspecified: Secondary | ICD-10-CM | POA: Diagnosis not present

## 2021-01-03 DIAGNOSIS — R0981 Nasal congestion: Secondary | ICD-10-CM | POA: Diagnosis not present

## 2021-01-03 DIAGNOSIS — J029 Acute pharyngitis, unspecified: Secondary | ICD-10-CM | POA: Diagnosis not present

## 2021-05-11 DIAGNOSIS — Z20822 Contact with and (suspected) exposure to covid-19: Secondary | ICD-10-CM | POA: Diagnosis not present

## 2021-05-11 DIAGNOSIS — U071 COVID-19: Secondary | ICD-10-CM | POA: Diagnosis not present

## 2021-09-20 DIAGNOSIS — J020 Streptococcal pharyngitis: Secondary | ICD-10-CM | POA: Diagnosis not present

## 2021-11-07 DIAGNOSIS — T3695XA Adverse effect of unspecified systemic antibiotic, initial encounter: Secondary | ICD-10-CM | POA: Diagnosis not present

## 2021-11-07 DIAGNOSIS — B379 Candidiasis, unspecified: Secondary | ICD-10-CM | POA: Diagnosis not present

## 2021-11-07 DIAGNOSIS — H66001 Acute suppurative otitis media without spontaneous rupture of ear drum, right ear: Secondary | ICD-10-CM | POA: Diagnosis not present

## 2021-11-07 DIAGNOSIS — J029 Acute pharyngitis, unspecified: Secondary | ICD-10-CM | POA: Diagnosis not present

## 2022-02-26 DIAGNOSIS — J029 Acute pharyngitis, unspecified: Secondary | ICD-10-CM | POA: Diagnosis not present

## 2022-02-26 DIAGNOSIS — B349 Viral infection, unspecified: Secondary | ICD-10-CM | POA: Diagnosis not present

## 2022-05-08 DIAGNOSIS — J029 Acute pharyngitis, unspecified: Secondary | ICD-10-CM | POA: Diagnosis not present

## 2022-05-08 DIAGNOSIS — B349 Viral infection, unspecified: Secondary | ICD-10-CM | POA: Diagnosis not present

## 2022-05-09 DIAGNOSIS — J40 Bronchitis, not specified as acute or chronic: Secondary | ICD-10-CM | POA: Diagnosis not present

## 2022-05-25 DIAGNOSIS — Z131 Encounter for screening for diabetes mellitus: Secondary | ICD-10-CM | POA: Diagnosis not present

## 2022-05-25 DIAGNOSIS — Z23 Encounter for immunization: Secondary | ICD-10-CM | POA: Diagnosis not present

## 2022-05-25 DIAGNOSIS — J358 Other chronic diseases of tonsils and adenoids: Secondary | ICD-10-CM | POA: Diagnosis not present

## 2022-05-25 DIAGNOSIS — Z0000 Encounter for general adult medical examination without abnormal findings: Secondary | ICD-10-CM | POA: Diagnosis not present

## 2022-06-13 DIAGNOSIS — Z01419 Encounter for gynecological examination (general) (routine) without abnormal findings: Secondary | ICD-10-CM | POA: Diagnosis not present

## 2022-06-13 DIAGNOSIS — Z1151 Encounter for screening for human papillomavirus (HPV): Secondary | ICD-10-CM | POA: Diagnosis not present

## 2022-06-13 DIAGNOSIS — Z124 Encounter for screening for malignant neoplasm of cervix: Secondary | ICD-10-CM | POA: Diagnosis not present

## 2022-06-27 DIAGNOSIS — J370 Chronic laryngitis: Secondary | ICD-10-CM | POA: Diagnosis not present

## 2022-06-27 DIAGNOSIS — J3501 Chronic tonsillitis: Secondary | ICD-10-CM | POA: Diagnosis not present

## 2022-07-08 DIAGNOSIS — J069 Acute upper respiratory infection, unspecified: Secondary | ICD-10-CM | POA: Diagnosis not present

## 2022-07-09 DIAGNOSIS — H66002 Acute suppurative otitis media without spontaneous rupture of ear drum, left ear: Secondary | ICD-10-CM | POA: Diagnosis not present

## 2022-07-09 DIAGNOSIS — Z6827 Body mass index (BMI) 27.0-27.9, adult: Secondary | ICD-10-CM | POA: Diagnosis not present

## 2022-07-09 DIAGNOSIS — J069 Acute upper respiratory infection, unspecified: Secondary | ICD-10-CM | POA: Diagnosis not present

## 2022-07-09 DIAGNOSIS — J029 Acute pharyngitis, unspecified: Secondary | ICD-10-CM | POA: Diagnosis not present
